# Patient Record
Sex: Male | Born: 1985 | Race: Black or African American | Hispanic: No | Marital: Single | Smoking: Never smoker
Health system: Southern US, Community
[De-identification: ages and names within clinical notes are randomized; demographics above are authoritative.]

## PROBLEM LIST (undated history)

## (undated) DIAGNOSIS — H469 Unspecified optic neuritis: Secondary | ICD-10-CM

---

## 2009-12-04 ENCOUNTER — Emergency Department (HOSPITAL_COMMUNITY): Admission: EM | Admit: 2009-12-04 | Discharge: 2009-12-04 | Payer: Self-pay | Admitting: Emergency Medicine

## 2015-01-19 ENCOUNTER — Emergency Department (HOSPITAL_COMMUNITY)
Admission: EM | Admit: 2015-01-19 | Discharge: 2015-01-19 | Disposition: A | Discharge status: Home / Self Care | Payer: BC Managed Care – PPO | Source: Home / Self Care | Attending: Family Medicine | Admitting: Family Medicine

## 2015-01-19 ENCOUNTER — Encounter (HOSPITAL_COMMUNITY): Payer: Self-pay | Admitting: Emergency Medicine

## 2015-01-19 DIAGNOSIS — G629 Polyneuropathy, unspecified: Secondary | ICD-10-CM

## 2015-01-19 HISTORY — DX: Unspecified optic neuritis: H46.9

## 2015-01-19 MED ORDER — PREDNISONE 10 MG PO KIT: PACK | ORAL | Status: DC

## 2015-01-19 NOTE — ED Provider Notes (Signed)
CSN: [id number]     Arrival date & time 01/19/15  1001 History   First MD Initiated Contact with Patient 01/19/15 1036     Chief Complaint  Patient presents with  . Numbness   (Consider location/radiation/quality/duration/timing/severity/associated sxs/prior Treatment) HPI  L facial numbness: started 7-8 days ago. Started out as pressure feeling around the eyes. Involves around the L eye and nose and l side of mouth. Denies pain or weakness. Deneis head trauma. Overall symptoms may be slightly improved. [facility] for [facility] infection.   Woke up today w/ R UE and LE numbness/tingling. R foot and R hand involved. Strength intact. Denies HA, syncoep, lightheadedness, vision change, CP, SOB, palpitations, fevers. May have slept on R side. Constant.    History reviewed. No pertinent past medical history. History reviewed. No pertinent past surgical history. Family History  Problem Relation Age of Onset  . Cancer Neg Hx   . Diabetes Neg Hx   . Heart failure Neg Hx   . Hyperlipidemia Neg Hx   . Hypertension Neg Hx    History  Substance Use Topics  . Smoking status: Never Smoker   . Smokeless tobacco: Not on file  . Alcohol Use: Yes    Review of Systems Per HPI with all other pertinent systems negative.   Allergies  Review of patient's allergies indicates no known allergies.  Home Medications   Prior to Admission medications   Medication Sig Start Date End Date Taking? Authorizing Provider  PredniSONE 10 MG KIT 12 day dose pack 01/19/15   Waldemar Dickens, MD   BP 115/80 mmHg  Pulse 72  Temp(Src) 98 F (36.7 C) (Oral)  Resp 14  SpO2 100% Physical Exam  Constitutional: He is oriented to person, place, and time. He appears well-developed and well-nourished. No distress.  HENT:  Head: Normocephalic and atraumatic.  Eyes: EOM are normal. Pupils are equal, round, and reactive to light.  Neck: Normal range of motion.  Cardiovascular: Normal rate,  normal heart sounds and intact distal pulses.   Pulmonary/Chest: Effort normal and breath sounds normal.  Abdominal: Soft. Bowel sounds are normal.  Musculoskeletal: Normal range of motion. He exhibits no edema or tenderness.  Neurological: He is alert and oriented to person, place, and time. He exhibits normal muscle tone.  CN2-12 intact, No dysmetria, Grip strength 5/5 bilat. Moves all extremities in coordinated fashion. Ambulation w/o difficulty.     Skin: Skin is warm and dry. No rash noted. He is not diaphoretic. No erythema. No pallor.  Psychiatric: He has a normal mood and affect. His behavior is normal. Judgment and thought content normal.    ED Course  Procedures (including critical care time) Labs Review Labs Reviewed - No data to display  Imaging Review No results found.   MDM   1. Neuropathy    Etiology unclear. No sign of acute stroke, Bell's palsy of the face or other acute intracranial process. Differential includes MS, idiopathic, other autoimmune dysfunction, vitamin deficiency, psychosomatic. Symptoms concerning enough along with history of distant optic neuritis that I feel it would be imperative for patient follow-up with neurologist. An appointment was made for March 15. Patient aware of this appointment Will start patient on 10 day steroid dose pack. Patient with strict instructions to go to the emergency room if symptoms significantly worsen.  Precautions given and all questions answered   Linna Darner, MD Family Medicine 01/19/2015, 11:27 AM      Waldemar Dickens,  MD 01/19/15 1127

## 2015-01-19 NOTE — Discharge Instructions (Signed)
The cause of your numbness is not clear. The neuropathy my be from an unknown cause (idiopathic), vitamin deficiency, or more serious condition such as MS. Please start the steroids and take them to completion.  Please follow up with with Dr. Everlena Cooper on 02/14/15 at 8:30. They will call you if an earlier appointment becomes available.   Please go to the emergency room if you get significantly worse.

## 2015-01-19 NOTE — ED Notes (Signed)
Reports numbness of left side of face on set this past Thursday night.  States couple days prior had pain and pressure in the left eye.  Woke this a.m with tingling in right and right foot.     [facility] and they felt symptoms was due to sinus infection and pt was prescribed antibiotics.

## 2015-01-20 ENCOUNTER — Ambulatory Visit (INDEPENDENT_AMBULATORY_CARE_PROVIDER_SITE_OTHER): Payer: BC Managed Care – PPO | Admitting: Neurology

## 2015-01-20 ENCOUNTER — Encounter: Payer: Self-pay | Admitting: Neurology

## 2015-01-20 VITALS — BP 116/70 | HR 82 | Temp 97.5°F | Resp 20 | Ht 72.0 in | Wt 152.0 lb

## 2015-01-20 DIAGNOSIS — R208 Other disturbances of skin sensation: Secondary | ICD-10-CM

## 2015-01-20 DIAGNOSIS — Z8669 Personal history of other diseases of the nervous system and sense organs: Secondary | ICD-10-CM

## 2015-01-20 DIAGNOSIS — R202 Paresthesia of skin: Secondary | ICD-10-CM

## 2015-01-20 DIAGNOSIS — R200 Anesthesia of skin: Secondary | ICD-10-CM

## 2015-01-20 NOTE — Progress Notes (Signed)
NEUROLOGY CONSULTATION NOTE  Howard Parker MRN: [id number] DOB: [birth year]  Referring provider: Dr. Marily Memos Primary care provider: None  Reason for consult:  Numbness, history of optic neuritis  HISTORY OF PRESENT ILLNESS: Howard Parker is a 29 year old right-handed man with remote history of optic neuritis who presents for multiple sclerosis.  Records from ED reviewed.  No labs or imaging were performed to review.  About a week ago, he developed left facial numbness associated with pressure around the left eye.  He went to a physician and was prescribed antibiotics for presumed sinusitis.  Yesterday, he woke up with numbness involving the right upper and lower extremities.  There was no associated pain or weakness.  At first, he thought he just slept on it but the numbness persisted.  He went to urgent care.  He was prescribed a prednisone taper and referred for neurology evaluation.  He reports the tingling sensation is around the left eye, cheek and left side of mouth in the face.  It also involves the entire right arm from the right side of the neck to the hand.  He notes the tingling from the right calf down to the foot.  He denies any visual changes or headache.  He has a remote history of right optic neuritis at least 10 years ago.  MRI of the brain was reportedly unremarkable.  He did not have an LP.  He was treated with IV Solumedrol and has not had any recurrence.  He still has some minor residual vision loss in the right eye.  He gets his eyes checked regularly.  He denies family history of demyelinating disease.  PAST MEDICAL HISTORY: Past Medical History  Diagnosis Date  . Optic neuritis     R. approximately 2008    PAST SURGICAL HISTORY: No past surgical history on file.  MEDICATIONS: Current Outpatient Prescriptions on File Prior to Visit  Medication Sig Dispense Refill  . PredniSONE 10 MG KIT 12 day dose pack 1 kit 0   No current facility-administered medications on  file prior to visit.    ALLERGIES: No Known Allergies  FAMILY HISTORY: Family History  Problem Relation Age of Onset  . Diabetes Neg Hx   . Heart failure Neg Hx   . Hyperlipidemia Neg Hx   . Cancer Maternal Grandmother     pancreatic   . Hypertension Paternal Grandmother     SOCIAL HISTORY: History   Social History  . Marital Status: Married    Spouse Name: N/A  . Number of Children: N/A  . Years of Education: N/A   Occupational History  . Not on file.   Social History Main Topics  . Smoking status: Never Smoker   . Smokeless tobacco: Never Used  . Alcohol Use: 0.0 oz/week    0 Standard drinks or equivalent per week     Comment: social   . Drug Use: No  . Sexual Activity:    Partners: Female   Other Topics Concern  . Not on file   Social History Narrative    REVIEW OF SYSTEMS: Constitutional: No fevers, chills, or sweats, no generalized fatigue, change in appetite Eyes: No visual changes, double vision, eye pain.  Mild residual blurred vision in right eye. Ear, nose and throat: No hearing loss, ear pain, nasal congestion, sore throat Cardiovascular: No chest pain, palpitations Respiratory:  No shortness of breath at rest or with exertion, wheezes GastrointestinaI: No nausea, vomiting, diarrhea, abdominal pain, fecal incontinence Genitourinary:  No dysuria,  urinary retention or frequency Musculoskeletal:  No neck pain, back pain Integumentary: No rash, pruritus, skin lesions Neurological: as above Psychiatric: No depression, insomnia, anxiety Endocrine: No palpitations, fatigue, diaphoresis, mood swings, change in appetite, change in weight, increased thirst Hematologic/Lymphatic:  No anemia, purpura, petechiae. Allergic/Immunologic: no itchy/runny eyes, nasal congestion, recent allergic reactions, rashes  PHYSICAL EXAM: Filed Vitals:   01/20/15 0932  BP: 116/70  Pulse: 82  Temp: 97.5 F (36.4 C)  Resp: 20   General: No acute distress Head:   Normocephalic/atraumatic Eyes:  fundi unremarkable, without vessel changes, exudates, hemorrhages or papilledema. Neck: supple, no paraspinal tenderness, full range of motion Back: No paraspinal tenderness Heart: regular rate and rhythm Lungs: Clear to auscultation bilaterally. Vascular: No carotid bruits. Neurological Exam: Mental status: alert and oriented to person, place, and time, recent and remote memory intact, fund of knowledge intact, attention and concentration intact, speech fluent and not dysarthric, language intact. Cranial nerves: CN I: not tested CN II: pupils equal, round and reactive to light, visual fields intact, fundi unremarkable, without vessel changes, exudates, hemorrhages or papilledema. CN III, IV, VI:  full range of motion, no nystagmus, no ptosis CN V: reduced sensation in left V2 distribution CN VII: upper and lower face symmetric CN VIII: hearing intact CN IX, X: gag intact, uvula midline CN XI: sternocleidomastoid and trapezius muscles intact CN XII: tongue midline Bulk & Tone: normal, no fasciculations. Motor:  5/5 throughout Sensation:  Pinprick and vibration intact. Deep Tendon Reflexes:  2+ (maybe slightly brisk but symmetric) throughout, toes downgoing Finger to nose testing:  No dysmetria Heel to shin:  No dysmetria Gait:  Normal station and stride.  Able to turn.  Had some minor difficulty with tandem walking. Romberg negative.  IMPRESSION: Left facial numbness and tingling Right sided numbness and tingling of arm and leg History of right optic neuritis.  A demyelinating disease is on the differential diagnosis.  PLAN: 1.  Will get MRI of brain and cervical spine with and without contrast 2.  Pending MRI results, further lab work will be pursued (or possible LP) 3.  Continue prednisone taper 4.  Follow up after testing  Thank you for allowing me to take part in the care of this patient.  Metta Clines, DO

## 2015-01-20 NOTE — Patient Instructions (Addendum)
First, we need to check MRI of brain and cervical spine with and without contrast.  MRI [facility]  02/02/15 at 3:45pm  Based on results, we will decide which tests need to be ordered (blood tests or possibly spinal tap).  Let's schedule follow up in 4 weeks for now to allow for any tests to be performed.

## 2015-02-01 ENCOUNTER — Ambulatory Visit (HOSPITAL_COMMUNITY)
Admission: RE | Admit: 2015-02-01 | Discharge: 2015-02-01 | Disposition: A | Discharge status: Home / Self Care | Payer: BC Managed Care – PPO | Source: Ambulatory Visit | Attending: Neurology | Admitting: Neurology

## 2015-02-01 DIAGNOSIS — G509 Disorder of trigeminal nerve, unspecified: Secondary | ICD-10-CM | POA: Diagnosis not present

## 2015-02-01 DIAGNOSIS — R202 Paresthesia of skin: Secondary | ICD-10-CM | POA: Insufficient documentation

## 2015-02-01 DIAGNOSIS — R200 Anesthesia of skin: Secondary | ICD-10-CM

## 2015-02-01 MED ORDER — GADOBENATE DIMEGLUMINE 529 MG/ML IV SOLN
15.0000 mL | Freq: Once | INTRAVENOUS | Status: AC | PRN
  Administered 2015-02-01: 14 mL via INTRAVENOUS

## 2015-02-02 ENCOUNTER — Other Ambulatory Visit: Payer: Self-pay | Admitting: *Deleted

## 2015-02-02 ENCOUNTER — Telehealth: Payer: Self-pay | Admitting: Neurology

## 2015-02-02 NOTE — Telephone Encounter (Signed)
I called and spoke with Howard Parker to discuss the MRI results.  It is consistent with MS.  I would like to set him up for Solu-Medrol  IV daily for 3 days.  He has an appointment with me on the 18th but we will try and get him in sooner to discuss management.  I answered all questions to the best of my ability.

## 2015-02-02 NOTE — Telephone Encounter (Signed)
Patient is aware of infusion day and time at [facility] on 02/06/15 at 8:45am the next 2 days he will schedule himself

## 2015-02-06 ENCOUNTER — Encounter (HOSPITAL_COMMUNITY)
Admission: RE | Admit: 2015-02-06 | Discharge: 2015-02-06 | Disposition: A | Discharge status: Home / Self Care | Payer: BC Managed Care – PPO | Source: Ambulatory Visit | Attending: Neurology | Admitting: Neurology

## 2015-02-06 ENCOUNTER — Other Ambulatory Visit (HOSPITAL_COMMUNITY): Payer: Self-pay | Admitting: *Deleted

## 2015-02-06 DIAGNOSIS — G35 Multiple sclerosis: Secondary | ICD-10-CM | POA: Diagnosis not present

## 2015-02-06 MED ORDER — SODIUM CHLORIDE 0.9 % IV SOLN
1000.0000 mg | Freq: Once | INTRAVENOUS | Status: AC
  Administered 2015-02-06: 1000 mg via INTRAVENOUS
  Filled 2015-02-06: qty 1000

## 2015-02-07 ENCOUNTER — Encounter (HOSPITAL_COMMUNITY)
Admission: RE | Admit: 2015-02-07 | Discharge: 2015-02-07 | Disposition: A | Discharge status: Home / Self Care | Payer: BC Managed Care – PPO | Source: Ambulatory Visit | Attending: Neurology | Admitting: Neurology

## 2015-02-07 DIAGNOSIS — G35 Multiple sclerosis: Secondary | ICD-10-CM | POA: Diagnosis not present

## 2015-02-07 MED ORDER — SODIUM CHLORIDE 0.9 % IV SOLN
1000.0000 mg | INTRAVENOUS | Status: DC
  Administered 2015-02-07: 1000 mg via INTRAVENOUS
  Filled 2015-02-07: qty 1000

## 2015-02-08 ENCOUNTER — Encounter (HOSPITAL_COMMUNITY)
Admission: RE | Admit: 2015-02-08 | Discharge: 2015-02-08 | Disposition: A | Discharge status: Home / Self Care | Payer: BC Managed Care – PPO | Source: Ambulatory Visit | Attending: Neurology | Admitting: Neurology

## 2015-02-08 DIAGNOSIS — G35 Multiple sclerosis: Secondary | ICD-10-CM | POA: Diagnosis not present

## 2015-02-08 MED ORDER — SODIUM CHLORIDE 0.9 % IV SOLN
1000.0000 mg | INTRAVENOUS | Status: DC
  Administered 2015-02-08: 1000 mg via INTRAVENOUS
  Filled 2015-02-08: qty 1000

## 2015-02-14 ENCOUNTER — Ambulatory Visit: Payer: BC Managed Care – PPO | Admitting: Neurology

## 2015-02-17 ENCOUNTER — Encounter: Payer: Self-pay | Admitting: Neurology

## 2015-02-17 ENCOUNTER — Ambulatory Visit (INDEPENDENT_AMBULATORY_CARE_PROVIDER_SITE_OTHER): Payer: BC Managed Care – PPO | Admitting: Neurology

## 2015-02-17 VITALS — BP 126/78 | HR 82 | Temp 97.9°F | Resp 20 | Ht 72.0 in | Wt 151.2 lb

## 2015-02-17 DIAGNOSIS — G35 Multiple sclerosis: Secondary | ICD-10-CM

## 2015-02-17 LAB — HEPATIC FUNCTION PANEL
ALT: 23 U/L (ref 0–53)
AST: 21 U/L (ref 0–37)
Albumin: 4.3 g/dL (ref 3.5–5.2)
Alkaline Phosphatase: 69 U/L (ref 39–117)
BILIRUBIN INDIRECT: 0.6 mg/dL (ref 0.2–1.2)
Bilirubin, Direct: 0.2 mg/dL (ref 0.0–0.3)
Total Bilirubin: 0.8 mg/dL (ref 0.2–1.2)
Total Protein: 7.1 g/dL (ref 6.0–8.3)

## 2015-02-17 LAB — CBC WITH DIFFERENTIAL/PLATELET
BASOS ABS: 0 10*3/uL (ref 0.0–0.1)
BASOS PCT: 0 % (ref 0–1)
EOS PCT: 2 % (ref 0–5)
Eosinophils Absolute: 0.1 10*3/uL (ref 0.0–0.7)
HEMATOCRIT: 47.9 % (ref 39.0–52.0)
Hemoglobin: 15.9 g/dL (ref 13.0–17.0)
LYMPHS ABS: 1.4 10*3/uL (ref 0.7–4.0)
Lymphocytes Relative: 27 % (ref 12–46)
MCH: 27.6 pg (ref 26.0–34.0)
MCHC: 33.2 g/dL (ref 30.0–36.0)
MCV: 83.2 fL (ref 78.0–100.0)
MONO ABS: 0.5 10*3/uL (ref 0.1–1.0)
MPV: 10.2 fL (ref 8.6–12.4)
Monocytes Relative: 9 % (ref 3–12)
NEUTROS ABS: 3.2 10*3/uL (ref 1.7–7.7)
NEUTROS PCT: 62 % (ref 43–77)
PLATELETS: 183 10*3/uL (ref 150–400)
RBC: 5.76 MIL/uL (ref 4.22–5.81)
RDW: 14.4 % (ref 11.5–15.5)
WBC: 5.2 10*3/uL (ref 4.0–10.5)

## 2015-02-17 NOTE — Patient Instructions (Signed)
1.  Please review the medications and call us with your choice. 2.  In the meantime, we will check some blood work (CBC with diff, LFTs, JC Virus antibody and vitamin D level) 3.  I want to see you back 3 months after starting your medication.  We will probably check blood work just prior to your follow up visit.

## 2015-02-17 NOTE — Progress Notes (Addendum)
NEUROLOGY FOLLOW UP OFFICE NOTE  Json Koelzer [id number]  HISTORY OF PRESENT ILLNESS: Howard Parker is a 29 year old right-handed man with remote history of optic neuritis who follows up for newly-diagnosed relapsing-remitting multiple sclerosis.  MRI of brain and cervical spine and labs reviewed.  He is accompanied by his parents who provide some history.  UPDATE: MRI of the brain with and without contrast performed on 02/01/15 showed 3 demyelinating lesions (in the pons, anterior right frontal lobe and left temporal lobe) with no enhancement.  MRI of the cervical spine showed focal right posterolateral lesion at the C5 level with no associated enhancement.  He underwent 3 days of 1gm of Solu-Medrol.  He still notes numbness and tingling in the right side but no weakness.  He said he started D3 5000 units this week.  HISTORY: In February, he developed left facial numbness associated with pressure around the left eye.  He went to a physician and was prescribed antibiotics for presumed sinusitis.  Yesterday, he woke up with numbness involving the right upper and lower extremities.  There was no associated pain or weakness.  At first, he thought he just slept on it but the numbness persisted.  He went to urgent care.  He was prescribed a prednisone taper and referred for neurology evaluation.  He reports the tingling sensation is around the left eye, cheek and left side of mouth in the face.  It also involves the entire right arm from the right side of the neck to the hand.  He notes the tingling from the right calf down to the foot.  He denies any visual changes or headache.  He has a remote history of right optic neuritis at least 10 years ago.  MRI of the brain was reportedly unremarkable.  He did not have an LP.  He was treated with IV Solumedrol and has not had any recurrence.  He still has some minor residual vision loss in the right eye.  He gets his eyes checked regularly.  He denies family  history of demyelinating disease.  PAST MEDICAL HISTORY: Past Medical History  Diagnosis Date  . Optic neuritis     R. approximately 2008    MEDICATIONS: Current Outpatient Prescriptions on File Prior to Visit  Medication Sig Dispense Refill  . PredniSONE 10 MG KIT 12 day dose pack (Patient not taking: Reported on 02/17/2015) 1 kit 0   No current facility-administered medications on file prior to visit.    ALLERGIES: No Known Allergies  FAMILY HISTORY: Family History  Problem Relation Age of Onset  . Diabetes Neg Hx   . Heart failure Neg Hx   . Hyperlipidemia Neg Hx   . Cancer Maternal Grandmother     pancreatic   . Hypertension Paternal Grandmother     SOCIAL HISTORY: History   Social History  . Marital Status: Single    Spouse Name: N/A  . Number of Children: N/A  . Years of Education: N/A   Occupational History  . Not on file.   Social History Main Topics  . Smoking status: Never Smoker   . Smokeless tobacco: Never Used  . Alcohol Use: 0.0 oz/week    0 Standard drinks or equivalent per week     Comment: social   . Drug Use: No  . Sexual Activity:    Partners: Female   Other Topics Concern  . Not on file   Social History Narrative    REVIEW OF SYSTEMS: Constitutional: No fevers,  chills, or sweats, no generalized fatigue, change in appetite Eyes: No visual changes, double vision, eye pain Ear, nose and throat: No hearing loss, ear pain, nasal congestion, sore throat Cardiovascular: No chest pain, palpitations Respiratory:  No shortness of breath at rest or with exertion, wheezes GastrointestinaI: No nausea, vomiting, diarrhea, abdominal pain, fecal incontinence Genitourinary:  No dysuria, urinary retention or frequency Musculoskeletal:  No neck pain, back pain Integumentary: No rash, pruritus, skin lesions Neurological: as above Psychiatric: No depression, insomnia, anxiety Endocrine: No palpitations, fatigue, diaphoresis, mood swings, change in  appetite, change in weight, increased thirst Hematologic/Lymphatic:  No anemia, purpura, petechiae. Allergic/Immunologic: no itchy/runny eyes, nasal congestion, recent allergic reactions, rashes  PHYSICAL EXAM: Filed Vitals:   02/17/15 0929  BP: 126/78  Pulse: 82  Temp: 97.9 F (36.6 C)  Resp: 20   General: No acute distress Head:  Normocephalic/atraumatic Eyes:  Fundoscopic exam unremarkable without vessel changes, exudates, hemorrhages or papilledema. Neck: supple, no paraspinal tenderness, full range of motion Heart:  Regular rate and rhythm Lungs:  Clear to auscultation bilaterally Back: No paraspinal tenderness Neurological Exam: alert and oriented to person, place, and time, recent and remote memory intact, fund of knowledge intact, attention and concentration intact, speech fluent and not dysarthric, language intact.  Saccadic eye movements on tracking.  Reduced sensation in left V2 distribution.  Otherwise CN II-XII intact.  Bulk and tone normal.  Muscle strength 5/5 throughout.  Reduced finger-thumb tapping speed and amplitude on right.  Sensation to pinprick and vibration intact.  Deep tendon reflexes 2+ brisk throughout except for 3+ in right lower extremity, toes downgoing.  Finger to nose intact.  Gait cautious with some trouble with tandem walking.  Romberg negative.  IMPRESSION: Relapsing-remitting multiple sclerosis, fitting McDonald Criteria  PLAN: 1.  Discussed various available disease-modifying agents in detail, including pros and cons.   2.  Will check CBC w/diff, LFTs, vitamin D level and JC Virus antibodies 3.  Will contact us ASAP with his choice of medication 4.  Follow up in 3 months after starting medication (will likely want to repeat labs prior to follow up)  45 minutes spent with patient and family, over 50% spent discussing disease and management options.  Metta Clines, DO

## 2015-02-18 LAB — VITAMIN D 25 HYDROXY (VIT D DEFICIENCY, FRACTURES): VIT D 25 HYDROXY: 36 ng/mL (ref 30–100)

## 2015-02-20 ENCOUNTER — Telehealth: Payer: Self-pay | Admitting: *Deleted

## 2015-02-20 ENCOUNTER — Telehealth: Payer: Self-pay | Admitting: Neurology

## 2015-02-20 NOTE — Telephone Encounter (Signed)
-----   Message from Drema Dallas, DO sent at 02/20/2015  7:05 AM EDT ----- Labs look okay.  Vitamin D level is 36, which is in the normal range.  I would remain on 5000 units daily.  JC virus antibodies are still pending ----- Message -----    From: Lab in [facility] Interface    Sent: 02/18/2015   2:12 AM      To: Drema Dallas, DO

## 2015-02-20 NOTE — Telephone Encounter (Signed)
Patient is aware of lab results he will continue vit d 5000 units . He has decided on Tecfidera

## 2015-02-20 NOTE — Telephone Encounter (Signed)
Left message for patient to return call to office. 

## 2015-02-20 NOTE — Telephone Encounter (Signed)
Pt called/returning your call at 1:14PM. C/b [phone]

## 2015-02-20 NOTE — Telephone Encounter (Signed)
Returning patients call at 2:10pm

## 2015-03-06 ENCOUNTER — Telehealth: Payer: Self-pay | Admitting: Neurology

## 2015-03-06 NOTE — Telephone Encounter (Signed)
Howard Parker, pt's mother called wanting to speak to a nurse regarding the medication for TECFIDERA. Pt has been waiting for 3 weeks for the medication to come in. Please call [facility] # [phone]

## 2015-03-06 NOTE — Telephone Encounter (Signed)
Patients mother Junius Roads is aware that all forms have been completed and sent to company and to insurance for his Tecfidera . I told her it could take up to 4 weeks before the meds are shipped I would let her  Know as soon as I hear that it is going to be shipped out

## 2015-05-26 ENCOUNTER — Ambulatory Visit: Payer: BC Managed Care – PPO | Admitting: Neurology

## 2015-05-31 ENCOUNTER — Other Ambulatory Visit: Payer: BC Managed Care – PPO

## 2015-05-31 ENCOUNTER — Telehealth: Payer: Self-pay | Admitting: *Deleted

## 2015-05-31 ENCOUNTER — Ambulatory Visit (INDEPENDENT_AMBULATORY_CARE_PROVIDER_SITE_OTHER): Payer: BC Managed Care – PPO | Admitting: Neurology

## 2015-05-31 ENCOUNTER — Encounter: Payer: Self-pay | Admitting: Neurology

## 2015-05-31 VITALS — BP 106/72 | HR 85 | Resp 16 | Ht 72.0 in | Wt 152.0 lb

## 2015-05-31 DIAGNOSIS — G35 Multiple sclerosis: Secondary | ICD-10-CM

## 2015-05-31 LAB — CBC WITH DIFFERENTIAL/PLATELET
Basophils Absolute: 0 10*3/uL (ref 0.0–0.1)
Basophils Relative: 0 % (ref 0–1)
Eosinophils Absolute: 0.1 10*3/uL (ref 0.0–0.7)
Eosinophils Relative: 3 % (ref 0–5)
HEMATOCRIT: 44.5 % (ref 39.0–52.0)
HEMOGLOBIN: 15 g/dL (ref 13.0–17.0)
LYMPHS PCT: 19 % (ref 12–46)
Lymphs Abs: 0.9 10*3/uL (ref 0.7–4.0)
MCH: 27.3 pg (ref 26.0–34.0)
MCHC: 33.7 g/dL (ref 30.0–36.0)
MCV: 81.1 fL (ref 78.0–100.0)
MONO ABS: 0.6 10*3/uL (ref 0.1–1.0)
MONOS PCT: 12 % (ref 3–12)
MPV: 10.4 fL (ref 8.6–12.4)
NEUTROS ABS: 3.2 10*3/uL (ref 1.7–7.7)
Neutrophils Relative %: 66 % (ref 43–77)
Platelets: 149 10*3/uL — ABNORMAL LOW (ref 150–400)
RBC: 5.49 MIL/uL (ref 4.22–5.81)
RDW: 14.1 % (ref 11.5–15.5)
WBC: 4.8 10*3/uL (ref 4.0–10.5)

## 2015-05-31 LAB — HEPATIC FUNCTION PANEL
ALT: 30 U/L (ref 0–53)
AST: 30 U/L (ref 0–37)
Albumin: 4.3 g/dL (ref 3.5–5.2)
Alkaline Phosphatase: 76 U/L (ref 39–117)
BILIRUBIN DIRECT: 0.1 mg/dL (ref 0.0–0.3)
BILIRUBIN INDIRECT: 0.4 mg/dL (ref 0.2–1.2)
Total Bilirubin: 0.5 mg/dL (ref 0.2–1.2)
Total Protein: 6.9 g/dL (ref 6.0–8.3)

## 2015-05-31 NOTE — Patient Instructions (Signed)
1.  Continue Tecfidera 2.  Continue D3 5000 units daily 3.  Check CBC with diff and LFTs today.  4.  Recheck CBC with diff, LFTs, MRI of the brain and cervical spine with and without contrast in 3 months with follow up soon afterwards.

## 2015-05-31 NOTE — Telephone Encounter (Signed)
Patient is aware of MRI brain and MRI Cervical spine on 09/05/15 at [facility]  3:45pm

## 2015-05-31 NOTE — Progress Notes (Signed)
NEUROLOGY FOLLOW UP OFFICE NOTE  Stefan Mcfeely [id number]  HISTORY OF PRESENT ILLNESS: Howard Parker is a 29 year old right-handed man with remote history of optic neuritis who follows up for newly-diagnosed relapsing-remitting multiple sclerosis.  MRI of brain and cervical spine and labs reviewed.  He is accompanied by his parents who provide some history.  UPDATE: Labs from March include normal CBC with differential, normal LFT and vitamin D level of 36.   He takes D3 5000 units daily.  He started Tecfidera and is doing well.  He does not feel any numbness.  He has no issues with the medication.  HISTORY: In February, he developed left facial numbness associated with pressure around the left eye.  He went to a physician and was prescribed antibiotics for presumed sinusitis.  In March, he woke up with numbness involving the right upper and lower extremities.  There was no associated pain or weakness.  At first, he thought he just slept on it but the numbness persisted.  He went to urgent care.  He was prescribed a prednisone taper and referred for neurology evaluation.  He noted tingling sensation around the left eye, cheek and left side of mouth in the face.  It also involved the entire right arm from the right side of the neck to the hand.  He notes the tingling from the right calf down to the foot.  He denies any visual changes or headache.  MRI of the brain with and without contrast performed on 02/01/15 showed 3 demyelinating lesions (in the pons, anterior right frontal lobe and left temporal lobe) with no enhancement.  MRI of the cervical spine showed focal right posterolateral lesion at the C5 level with no associated enhancement.  He underwent 3 days of 1gm of Solu-Medrol.  He has a remote history of right optic neuritis at least 10 years ago.  MRI of the brain was reportedly unremarkable.  He did not have an LP.  He was treated with IV Solumedrol and has not had any recurrence.  He still has  some minor residual vision loss in the right eye.  He gets his eyes checked regularly.  He denies family history of demyelinating disease.  PAST MEDICAL HISTORY: Past Medical History  Diagnosis Date  . Optic neuritis     R. approximately 2008    MEDICATIONS: No current outpatient prescriptions on file prior to visit.   No current facility-administered medications on file prior to visit.    ALLERGIES: No Known Allergies  FAMILY HISTORY: Family History  Problem Relation Age of Onset  . Diabetes Neg Hx   . Heart failure Neg Hx   . Hyperlipidemia Neg Hx   . Cancer Maternal Grandmother     pancreatic   . Hypertension Paternal Grandmother     SOCIAL HISTORY: History   Social History  . Marital Status: Single    Spouse Name: N/A  . Number of Children: N/A  . Years of Education: N/A   Occupational History  . Not on file.   Social History Main Topics  . Smoking status: Never Smoker   . Smokeless tobacco: Never Used  . Alcohol Use: 0.0 oz/week    0 Standard drinks or equivalent per week     Comment: social   . Drug Use: No  . Sexual Activity:    Partners: Female   Other Topics Concern  . Not on file   Social History Narrative    REVIEW OF SYSTEMS: Constitutional: No fevers,  chills, or sweats, no generalized fatigue, change in appetite Eyes: No visual changes, double vision, eye pain Ear, nose and throat: No hearing loss, ear pain, nasal congestion, sore throat Cardiovascular: No chest pain, palpitations Respiratory:  No shortness of breath at rest or with exertion, wheezes GastrointestinaI: No nausea, vomiting, diarrhea, abdominal pain, fecal incontinence Genitourinary:  No dysuria, urinary retention or frequency Musculoskeletal:  No neck pain, back pain Integumentary: No rash, pruritus, skin lesions Neurological: as above Psychiatric: No depression, insomnia, anxiety Endocrine: No palpitations, fatigue, diaphoresis, mood swings, change in appetite, change  in weight, increased thirst Hematologic/Lymphatic:  No anemia, purpura, petechiae. Allergic/Immunologic: no itchy/runny eyes, nasal congestion, recent allergic reactions, rashes  PHYSICAL EXAM: Filed Vitals:   05/31/15 1352  BP: 106/72  Pulse: 85  Resp: 16   General: No acute distress Head:  Normocephalic/atraumatic Eyes:  Fundoscopic exam unremarkable without vessel changes, exudates, hemorrhages or papilledema. Neck: supple, no paraspinal tenderness, full range of motion Heart:  Regular rate and rhythm Lungs:  Clear to auscultation bilaterally Back: No paraspinal tenderness Neurological Exam: alert and oriented to person, place, and time. Attention span and concentration intact, recent and remote memory intact, fund of knowledge intact.  Speech fluent and not dysarthric, language intact.  CN II-XII intact. Fundoscopic exam unremarkable without vessel changes, exudates, hemorrhages or papilledema.  Bulk and tone normal, muscle strength 5/5 throughout.  Sensation to light touch, temperature and vibration intact.  Deep tendon reflexes 2+ throughout, toes downgoing.  Finger to nose and heel to shin testing intact.  Gait normal, Timed 25 foot walk 3.35 seconds.  Romberg negative.  IMPRESSION: Relapsing remitting MS  PLAN: Tecfidera Vitamin D3 Check CBC w/diff and LFTs today Recheck CBC with diff, LFTs and MRI of brain/cervical spine with and without contrast in 3 months with follow up soon afterwards.  22 minutes spent face to face with patient, over 50% spent discussing management.   Shon Millet, DO

## 2015-06-19 ENCOUNTER — Other Ambulatory Visit: Payer: Self-pay | Admitting: *Deleted

## 2015-06-19 ENCOUNTER — Telehealth: Payer: Self-pay | Admitting: *Deleted

## 2015-06-19 DIAGNOSIS — G35 Multiple sclerosis: Secondary | ICD-10-CM

## 2015-06-19 NOTE — Telephone Encounter (Signed)
-----   Message from Drema Dallas, DO sent at 06/02/2015  7:39 AM EDT ----- Labs showed that his platelets have dropped a little bit.  I would like to repeat CBC with diff in 4 weeks.

## 2015-06-19 NOTE — Telephone Encounter (Signed)
Patient is aware Labs showed that his platelets have dropped a little bit. I would like to repeat CBC with diff in 4 weeks.

## 2015-07-04 ENCOUNTER — Other Ambulatory Visit: Payer: BC Managed Care – PPO

## 2015-07-04 DIAGNOSIS — G35 Multiple sclerosis: Secondary | ICD-10-CM

## 2015-07-04 LAB — CBC WITH DIFFERENTIAL/PLATELET
BASOS ABS: 0 10*3/uL (ref 0.0–0.1)
BASOS PCT: 1 % (ref 0–1)
EOS PCT: 3 % (ref 0–5)
Eosinophils Absolute: 0.1 10*3/uL (ref 0.0–0.7)
HCT: 42.6 % (ref 39.0–52.0)
Hemoglobin: 14.7 g/dL (ref 13.0–17.0)
LYMPHS PCT: 20 % (ref 12–46)
Lymphs Abs: 0.9 10*3/uL (ref 0.7–4.0)
MCH: 27.6 pg (ref 26.0–34.0)
MCHC: 34.5 g/dL (ref 30.0–36.0)
MCV: 80.1 fL (ref 78.0–100.0)
MONO ABS: 0.5 10*3/uL (ref 0.1–1.0)
MONOS PCT: 12 % (ref 3–12)
MPV: 10.7 fL (ref 8.6–12.4)
Neutro Abs: 2.8 10*3/uL (ref 1.7–7.7)
Neutrophils Relative %: 64 % (ref 43–77)
Platelets: 165 10*3/uL (ref 150–400)
RBC: 5.32 MIL/uL (ref 4.22–5.81)
RDW: 14 % (ref 11.5–15.5)
WBC: 4.3 10*3/uL (ref 4.0–10.5)

## 2015-07-10 ENCOUNTER — Telehealth: Payer: Self-pay | Admitting: *Deleted

## 2015-07-10 NOTE — Telephone Encounter (Signed)
-----   Message from Van Clines, MD sent at 07/05/2015  8:32 AM EDT ----- Pls let him know bloodwork is normal, thanks

## 2015-07-10 NOTE — Telephone Encounter (Signed)
Patient is aware labs are normal

## 2015-07-13 ENCOUNTER — Telehealth: Payer: Self-pay | Admitting: *Deleted

## 2015-07-13 ENCOUNTER — Other Ambulatory Visit: Payer: Self-pay | Admitting: *Deleted

## 2015-07-13 DIAGNOSIS — Z79899 Other long term (current) drug therapy: Secondary | ICD-10-CM

## 2015-07-13 DIAGNOSIS — G35 Multiple sclerosis: Secondary | ICD-10-CM

## 2015-07-13 NOTE — Telephone Encounter (Signed)
Patient is aware of labs he will come in on 08/10/15 for cbc with diff to be drawn

## 2015-07-13 NOTE — Telephone Encounter (Signed)
-----   Message from Drema Dallas, DO sent at 07/10/2015  9:02 AM EDT ----- Platelet count has improved.  However, I would like to recheck CBC in 4 weeks to follow up.

## 2015-09-05 ENCOUNTER — Ambulatory Visit (HOSPITAL_COMMUNITY)
Admission: RE | Admit: 2015-09-05 | Discharge: 2015-09-05 | Disposition: A | Discharge status: Home / Self Care | Payer: BC Managed Care – PPO | Source: Ambulatory Visit | Attending: Neurology | Admitting: Neurology

## 2015-09-05 DIAGNOSIS — R51 Headache: Secondary | ICD-10-CM | POA: Diagnosis not present

## 2015-09-05 DIAGNOSIS — M5030 Other cervical disc degeneration, unspecified cervical region: Secondary | ICD-10-CM | POA: Insufficient documentation

## 2015-09-05 DIAGNOSIS — G35 Multiple sclerosis: Secondary | ICD-10-CM

## 2015-09-05 MED ORDER — GADOBENATE DIMEGLUMINE 529 MG/ML IV SOLN
15.0000 mL | Freq: Once | INTRAVENOUS | Status: AC | PRN
  Administered 2015-09-05: 13 mL via INTRAVENOUS

## 2015-09-06 ENCOUNTER — Other Ambulatory Visit: Payer: Self-pay | Admitting: *Deleted

## 2015-09-06 ENCOUNTER — Telehealth: Payer: Self-pay | Admitting: Neurology

## 2015-09-06 DIAGNOSIS — Z8669 Personal history of other diseases of the nervous system and sense organs: Secondary | ICD-10-CM

## 2015-09-06 DIAGNOSIS — R202 Paresthesia of skin: Secondary | ICD-10-CM

## 2015-09-06 DIAGNOSIS — R200 Anesthesia of skin: Secondary | ICD-10-CM

## 2015-09-06 DIAGNOSIS — G35 Multiple sclerosis: Secondary | ICD-10-CM

## 2015-09-06 NOTE — Telephone Encounter (Signed)
Pt/mother/Kena/ called to get more info on son's recent MRI/ call back @ [phone]

## 2015-09-06 NOTE — Telephone Encounter (Signed)
Called Howard Parker back and left message for her to call me.

## 2015-09-11 ENCOUNTER — Telehealth: Payer: Self-pay | Admitting: Neurology

## 2015-09-11 NOTE — Telephone Encounter (Signed)
Printed all imagining studies and will leave out front for patients mother to pick up.. Mother is on his release of information

## 2015-09-11 NOTE — Telephone Encounter (Signed)
Pt/ mother/Kena/ called to request copies of his new and old MRI's/ call back @ [phone]

## 2015-09-13 ENCOUNTER — Ambulatory Visit (INDEPENDENT_AMBULATORY_CARE_PROVIDER_SITE_OTHER): Payer: BC Managed Care – PPO | Admitting: Neurology

## 2015-09-13 ENCOUNTER — Encounter: Payer: Self-pay | Admitting: Neurology

## 2015-09-13 ENCOUNTER — Other Ambulatory Visit (INDEPENDENT_AMBULATORY_CARE_PROVIDER_SITE_OTHER): Payer: BC Managed Care – PPO

## 2015-09-13 VITALS — BP 118/68 | HR 97 | Ht 71.5 in | Wt 146.0 lb

## 2015-09-13 DIAGNOSIS — G35 Multiple sclerosis: Secondary | ICD-10-CM

## 2015-09-13 LAB — CBC WITH DIFFERENTIAL/PLATELET
BASOS ABS: 0 10*3/uL (ref 0.0–0.1)
Basophils Relative: 0.5 % (ref 0.0–3.0)
Eosinophils Absolute: 0.1 10*3/uL (ref 0.0–0.7)
Eosinophils Relative: 2.7 % (ref 0.0–5.0)
HEMATOCRIT: 42.7 % (ref 39.0–52.0)
Hemoglobin: 14.1 g/dL (ref 13.0–17.0)
LYMPHS ABS: 0.8 10*3/uL (ref 0.7–4.0)
Lymphocytes Relative: 22.2 % (ref 12.0–46.0)
MCHC: 33.1 g/dL (ref 30.0–36.0)
MCV: 82.7 fl (ref 78.0–100.0)
MONO ABS: 0.3 10*3/uL (ref 0.1–1.0)
Monocytes Relative: 9.4 % (ref 3.0–12.0)
Neutro Abs: 2.3 10*3/uL (ref 1.4–7.7)
Neutrophils Relative %: 65.2 % (ref 43.0–77.0)
Platelets: 165 10*3/uL (ref 150.0–400.0)
RBC: 5.16 Mil/uL (ref 4.22–5.81)
RDW: 14.3 % (ref 11.5–15.5)
WBC: 3.6 10*3/uL — ABNORMAL LOW (ref 4.0–10.5)

## 2015-09-13 LAB — HEPATIC FUNCTION PANEL
ALK PHOS: 60 U/L (ref 39–117)
ALT: 19 U/L (ref 0–53)
AST: 20 U/L (ref 0–37)
Albumin: 4.2 g/dL (ref 3.5–5.2)
BILIRUBIN DIRECT: 0.2 mg/dL (ref 0.0–0.3)
BILIRUBIN TOTAL: 0.6 mg/dL (ref 0.2–1.2)
TOTAL PROTEIN: 6.7 g/dL (ref 6.0–8.3)

## 2015-09-13 LAB — VITAMIN D 25 HYDROXY (VIT D DEFICIENCY, FRACTURES): VITD: 94.24 ng/mL (ref 30.00–100.00)

## 2015-09-13 NOTE — Patient Instructions (Signed)
Continue Tecfidera Continue vitamin D3 5000 units daily Check CBC with diff, LFTs and Vitamin D level today.  Repeat CBC with diff and LFTs in 3 months prior to follow up Follow up in 3 months.

## 2015-09-13 NOTE — Progress Notes (Addendum)
NEUROLOGY FOLLOW UP OFFICE NOTE  Howard Parker [id number]  HISTORY OF PRESENT ILLNESS: Howard Parker is a 29 year old right-handed man with remote history of optic neuritis who follows up for newly-diagnosed relapsing-remitting multiple sclerosis.  MRI of brain and cervical spine and labs reviewed.    UPDATE: He takes D3 5000 units daily.  He takes Information systems manager.  He is doing well.  Sometimes, he notes flushing, which is better since taking aspirin.    When he first started Tecfidera, he had a drop in platelets from 183,000 to 149,000.  Repeat testing in August showed that it increased to 165,000.  WBC was 4.3 K/uL with absolute lymphocyte count of 900.  MRI of brain and cervical spine with and without contrast performed on 09/05/15 showed mild regression of lesions in brain, including left brainstem and middle cerebellar penduncle, as well as spinal cord.  HISTORY: In February, he developed left facial numbness associated with pressure around the left eye.  He went to a physician and was prescribed antibiotics for presumed sinusitis.  In March, he woke up with numbness involving the right upper and lower extremities.  There was no associated pain or weakness.  At first, he thought he just slept on it but the numbness persisted.  He went to urgent care.  He was prescribed a prednisone taper and referred for neurology evaluation.  He noted tingling sensation around the left eye, cheek and left side of mouth in the face.  It also involved the entire right arm from the right side of the neck to the hand.  He notes the tingling from the right calf down to the foot.  He denies any visual changes or headache.  MRI of the brain with and without contrast performed on 02/01/15 showed 3 demyelinating lesions (in the pons, anterior right frontal lobe and left temporal lobe) with no enhancement.  MRI of the cervical spine showed focal right posterolateral lesion at the C5 level with no associated enhancement. He  underwent 3 days of 1gm of Solu-Medrol.  He has a remote history of right optic neuritis at least 10 years ago.  MRI of the brain was reportedly unremarkable.  He did not have an LP.  He was treated with IV Solumedrol and has not had any recurrence.  He still has some minor residual vision loss in the right eye.  He gets his eyes checked regularly.  He denies family history of demyelinating disease.  PAST MEDICAL HISTORY: Past Medical History  Diagnosis Date  . Optic neuritis     R. approximately 2008    MEDICATIONS: Current Outpatient Prescriptions on File Prior to Visit  Medication Sig Dispense Refill  . Dimethyl Fumarate (TECFIDERA) 240 MG CPDR Take by mouth 2 (two) times daily.     No current facility-administered medications on file prior to visit.    ALLERGIES: No Known Allergies  FAMILY HISTORY: Family History  Problem Relation Age of Onset  . Diabetes Neg Hx   . Heart failure Neg Hx   . Hyperlipidemia Neg Hx   . Cancer Maternal Grandmother     pancreatic   . Hypertension Paternal Grandmother     SOCIAL HISTORY: Social History   Social History  . Marital Status: Single    Spouse Name: N/A  . Number of Children: N/A  . Years of Education: N/A   Occupational History  . Not on file.   Social History Main Topics  . Smoking status: Never Smoker   . Smokeless  tobacco: Never Used  . Alcohol Use: 0.0 oz/week    0 Standard drinks or equivalent per week     Comment: social   . Drug Use: No  . Sexual Activity:    Partners: Female   Other Topics Concern  . Not on file   Social History Narrative    REVIEW OF SYSTEMS: Constitutional: No fevers, chills, or sweats, no generalized fatigue, change in appetite Eyes: No visual changes, double vision, eye pain Ear, nose and throat: No hearing loss, ear pain, nasal congestion, sore throat Cardiovascular: No chest pain, palpitations Respiratory:  No shortness of breath at rest or with exertion,  wheezes GastrointestinaI: No nausea, vomiting, diarrhea, abdominal pain, fecal incontinence Genitourinary:  No dysuria, urinary retention or frequency Musculoskeletal:  No neck pain, back pain Integumentary: No rash, pruritus, skin lesions Neurological: as above Psychiatric: No depression, insomnia, anxiety Endocrine: No palpitations, fatigue, diaphoresis, mood swings, change in appetite, change in weight, increased thirst Hematologic/Lymphatic:  No anemia, purpura, petechiae. Allergic/Immunologic: no itchy/runny eyes, nasal congestion, recent allergic reactions, rashes  PHYSICAL EXAM: Filed Vitals:   09/13/15 1338  BP: 118/68  Pulse: 97   General: No acute distress.  Patient appears well-groomed.  normal body habitus. Head:  Normocephalic/atraumatic Eyes:  Fundoscopic exam unremarkable without vessel changes, exudates, hemorrhages or papilledema. Neck: supple, no paraspinal tenderness, full range of motion Heart:  Regular rate and rhythm Lungs:  Clear to auscultation bilaterally Back: No paraspinal tenderness Neurological Exam: alert and oriented to person, place, and time. Attention span and concentration intact, recent and remote memory intact, fund of knowledge intact.  Speech fluent and not dysarthric, language intact.  CN II-XII intact. Fundoscopic exam unremarkable without vessel changes, exudates, hemorrhages or papilledema.  Bulk and tone normal, muscle strength 5/5 throughout.  Sensation to light touch, temperature and vibration intact.  Deep tendon reflexes 2+ throughout, toes downgoing.  Finger to nose and heel to shin testing intact.  Gait normal, Timed 25 foot walk 3.03 seconds.  Romberg negative.  IMPRESSION: Relapsing-remitting MS, stable  PLAN: 1.  Tecfidera 2.  Vitamin D3 5000 IU daily 3.  Check CBC with diff, LFTs and D level today. ADDENDUM:  CBC unremarkable with WBC of 3.6 and absolute lymphocyte count of 0.8 K/uL.  LFTs normal.  Vitamin D level 94.  4.  Recheck  CBC with diff and LFTs in 3 months, about a week prior to follow up.  Shon Millet, DO

## 2015-09-22 ENCOUNTER — Ambulatory Visit: Payer: BC Managed Care – PPO | Admitting: Neurology

## 2015-10-30 ENCOUNTER — Other Ambulatory Visit: Payer: Self-pay

## 2015-10-30 DIAGNOSIS — G35 Multiple sclerosis: Secondary | ICD-10-CM

## 2015-10-30 MED ORDER — DIMETHYL FUMARATE 240 MG PO CPDR: 240.0000 mg | DELAYED_RELEASE_CAPSULE | Freq: Two times a day (BID) | ORAL | Status: DC

## 2015-11-14 ENCOUNTER — Telehealth: Payer: Self-pay | Admitting: Neurology

## 2015-11-14 NOTE — Telephone Encounter (Signed)
Message relayed to patient. Verbalized understanding and denied questions.   

## 2015-11-14 NOTE — Telephone Encounter (Signed)
Spoke with patient. States he has been having "weird" symptoms for the last few days. Severe lightheadedness when he first wakes up. Dissipates throughout the day, but will have bouts of lightheadedness/dizziness as he moves he head throughout the day. Pt also having pressure in his head when he bends down. Wants to know if this is a serious issue, it related to MS. Please advise.

## 2015-11-14 NOTE — Telephone Encounter (Signed)
PT called and said he was having some weird symptoms and would like a call back/Dawn CB# [phone]

## 2015-11-14 NOTE — Telephone Encounter (Signed)
I don't think it is related to MS.  He should keep hydrated or see his PCP/Urgent Care if it gets worse.

## 2015-12-07 ENCOUNTER — Telehealth: Payer: Self-pay | Admitting: Neurology

## 2015-12-07 ENCOUNTER — Other Ambulatory Visit: Payer: Self-pay

## 2015-12-07 DIAGNOSIS — G35 Multiple sclerosis: Secondary | ICD-10-CM

## 2015-12-07 MED ORDER — DIMETHYL FUMARATE 240 MG PO CPDR: 240.0000 mg | DELAYED_RELEASE_CAPSULE | Freq: Two times a day (BID) | ORAL | Status: DC

## 2015-12-07 NOTE — Telephone Encounter (Signed)
Pt needs to have his tecfidera called in pt phone number is [phone]

## 2015-12-18 ENCOUNTER — Telehealth: Payer: Self-pay

## 2015-12-18 DIAGNOSIS — G35 Multiple sclerosis: Secondary | ICD-10-CM

## 2015-12-18 NOTE — Telephone Encounter (Signed)
Spoke w/ patient. He will come in this week to have labs drawn. Pt did ask about his Tecfidera,Rx was submitted on 12/07/15. Pt wanted to know when RX should arrive. Called [facility] @ [phone], spoke with Morrie Sheldon. States they have not sent out prescription yet because pt has not pain copay of 300. Was given tecfidera patient assistance number at [phone].

## 2015-12-18 NOTE — Telephone Encounter (Signed)
Called patient back. Was getting Rx from [facility], but RX pharmacy changed at new year. Pt was given [facility] number to call and pay copay.

## 2015-12-18 NOTE — Telephone Encounter (Signed)
-----   Message from Richarda Overlie, [place] sent at 10/30/2015 10:46 AM EST ----- Recheck CBC with diff and LFTs in 3 months, about a week prior to follow up.

## 2015-12-26 ENCOUNTER — Other Ambulatory Visit (INDEPENDENT_AMBULATORY_CARE_PROVIDER_SITE_OTHER): Payer: BC Managed Care – PPO

## 2015-12-26 DIAGNOSIS — R208 Other disturbances of skin sensation: Secondary | ICD-10-CM

## 2015-12-26 DIAGNOSIS — R202 Paresthesia of skin: Secondary | ICD-10-CM

## 2015-12-26 DIAGNOSIS — R200 Anesthesia of skin: Secondary | ICD-10-CM

## 2015-12-26 DIAGNOSIS — Z8669 Personal history of other diseases of the nervous system and sense organs: Secondary | ICD-10-CM

## 2015-12-26 DIAGNOSIS — G35 Multiple sclerosis: Secondary | ICD-10-CM

## 2015-12-26 LAB — HEPATIC FUNCTION PANEL
ALBUMIN: 4.4 g/dL (ref 3.5–5.2)
ALT: 17 U/L (ref 0–53)
AST: 20 U/L (ref 0–37)
Alkaline Phosphatase: 64 U/L (ref 39–117)
Bilirubin, Direct: 0.1 mg/dL (ref 0.0–0.3)
TOTAL PROTEIN: 7.1 g/dL (ref 6.0–8.3)
Total Bilirubin: 0.5 mg/dL (ref 0.2–1.2)

## 2015-12-26 LAB — CBC WITH DIFFERENTIAL/PLATELET
BASOS ABS: 0 10*3/uL (ref 0.0–0.1)
Basophils Relative: 0.3 % (ref 0.0–3.0)
EOS ABS: 0.1 10*3/uL (ref 0.0–0.7)
EOS PCT: 2.1 % (ref 0.0–5.0)
HCT: 44.9 % (ref 39.0–52.0)
HEMOGLOBIN: 14.8 g/dL (ref 13.0–17.0)
Lymphocytes Relative: 15.9 % (ref 12.0–46.0)
Lymphs Abs: 0.8 10*3/uL (ref 0.7–4.0)
MCHC: 32.9 g/dL (ref 30.0–36.0)
MCV: 82.6 fl (ref 78.0–100.0)
MONO ABS: 0.6 10*3/uL (ref 0.1–1.0)
Monocytes Relative: 11.1 % (ref 3.0–12.0)
Neutro Abs: 3.5 10*3/uL (ref 1.4–7.7)
Neutrophils Relative %: 70.6 % (ref 43.0–77.0)
Platelets: 168 10*3/uL (ref 150.0–400.0)
RBC: 5.43 Mil/uL (ref 4.22–5.81)
RDW: 13.9 % (ref 11.5–15.5)
WBC: 5 10*3/uL (ref 4.0–10.5)

## 2015-12-28 ENCOUNTER — Encounter: Payer: Self-pay | Admitting: Neurology

## 2015-12-28 ENCOUNTER — Ambulatory Visit (INDEPENDENT_AMBULATORY_CARE_PROVIDER_SITE_OTHER): Payer: BC Managed Care – PPO | Admitting: Neurology

## 2015-12-28 VITALS — BP 110/66 | HR 72 | Ht 71.0 in | Wt 143.0 lb

## 2015-12-28 DIAGNOSIS — G35 Multiple sclerosis: Secondary | ICD-10-CM

## 2015-12-28 DIAGNOSIS — G44219 Episodic tension-type headache, not intractable: Secondary | ICD-10-CM

## 2015-12-28 NOTE — Patient Instructions (Signed)
1.  Continue Tecfidera and D3 5000 IU daily 2.  Try Excedrin Tension for tension headaches.  Limit use to no more than 2 days out of the week to prevent rebound headache.   If they become too frequent, we can try a preventative medication such as nortriptyline 3.  Repeat CBC with diff, MRI of brain and cervical spine with and without contrast in 6 months prior to follow up 4.  Follow up in 6 months.

## 2015-12-28 NOTE — Progress Notes (Signed)
NEUROLOGY FOLLOW UP OFFICE NOTE  Howard Parker [id number]  HISTORY OF PRESENT ILLNESS: Howard Parker is a 30 year old right-handed man with remote history of optic neuritis who follows up for newly-diagnosed relapsing-remitting multiple sclerosis.  MRI of brain and cervical spine and labs reviewed.    UPDATE: He takes D3 5000 units daily (last level was 94).  He takes Information systems manager.  He is doing well.  Sometimes, he notes flushing, which is better since taking aspirin.  He sometimes has tension-type headaches. Labs from 12/26/15:  WBC 5, ALC 800, LFTs normal.  HISTORY: In February, he developed left facial numbness associated with pressure around the left eye.  He went to a physician and was prescribed antibiotics for presumed sinusitis.  In March, he woke up with numbness involving the right upper and lower extremities.  There was no associated pain or weakness.  At first, he thought he just slept on it but the numbness persisted.  He went to urgent care.  He was prescribed a prednisone taper and referred for neurology evaluation.  He noted tingling sensation around the left eye, cheek and left side of mouth in the face.  It also involved the entire right arm from the right side of the neck to the hand.  He notes the tingling from the right calf down to the foot.  He denies any visual changes or headache.  MRI of the brain with and without contrast performed on 02/01/15 showed 3 demyelinating lesions (in the pons, anterior right frontal lobe and left temporal lobe) with no enhancement.  MRI of the cervical spine showed focal right posterolateral lesion at the C5 level with no associated enhancement. He underwent 3 days of 1gm of Solu-Medrol.  He has a remote history of right optic neuritis at least 10 years ago.  MRI of the brain was reportedly unremarkable.  He did not have an LP.  He was treated with IV Solumedrol and has not had any recurrence.  He still has some minor residual vision loss in the right  eye.  He gets his eyes checked regularly.  He denies family history of demyelinating disease.  MRI of brain and cervical spine with and without contrast performed on 09/05/15 showed mild regression of lesions in brain, including left brainstem and middle cerebellar penduncle, as well as spinal cord.  He is a Editor, commissioning.  PAST MEDICAL HISTORY: Past Medical History  Diagnosis Date  . Optic neuritis     R. approximately 2008    MEDICATIONS: Current Outpatient Prescriptions on File Prior to Visit  Medication Sig Dispense Refill  . Dimethyl Fumarate (TECFIDERA) 240 MG CPDR Take 1 capsule (240 mg total) by mouth 2 (two) times daily. 180 capsule 3   No current facility-administered medications on file prior to visit.    ALLERGIES: No Known Allergies  FAMILY HISTORY: Family History  Problem Relation Age of Onset  . Diabetes Neg Hx   . Heart failure Neg Hx   . Hyperlipidemia Neg Hx   . Cancer Maternal Grandmother     pancreatic   . Hypertension Paternal Grandmother     SOCIAL HISTORY: Social History   Social History  . Marital Status: Single    Spouse Name: N/A  . Number of Children: N/A  . Years of Education: N/A   Occupational History  . Not on file.   Social History Main Topics  . Smoking status: Never Smoker   . Smokeless tobacco: Never Used  . Alcohol Use: 0.0  oz/week    0 Standard drinks or equivalent per week     Comment: social   . Drug Use: No  . Sexual Activity:    Partners: Female   Other Topics Concern  . Not on file   Social History Narrative    REVIEW OF SYSTEMS: Constitutional: No fevers, chills, or sweats, no generalized fatigue, change in appetite Eyes: No visual changes, double vision, eye pain Ear, nose and throat: No hearing loss, ear pain, nasal congestion, sore throat Cardiovascular: No chest pain, palpitations Respiratory:  No shortness of breath at rest or with exertion, wheezes GastrointestinaI: No nausea, vomiting, diarrhea,  abdominal pain, fecal incontinence Genitourinary:  No dysuria, urinary retention or frequency Musculoskeletal:  No neck pain, back pain Integumentary: No rash, pruritus, skin lesions Neurological: as above Psychiatric: No depression, insomnia, anxiety Endocrine: No palpitations, fatigue, diaphoresis, mood swings, change in appetite, change in weight, increased thirst Hematologic/Lymphatic:  No anemia, purpura, petechiae. Allergic/Immunologic: no itchy/runny eyes, nasal congestion, recent allergic reactions, rashes  PHYSICAL EXAM: Filed Vitals:   12/28/15 1421  BP: 110/66  Pulse: 72   General: No acute distress.  Patient appears well-groomed.  normal body habitus. Head:  Normocephalic/atraumatic Eyes:  Fundoscopic exam unremarkable without vessel changes, exudates, hemorrhages or papilledema. Neck: supple, no paraspinal tenderness, full range of motion Heart:  Regular rate and rhythm Lungs:  Clear to auscultation bilaterally Back: No paraspinal tenderness Neurological Exam: alert and oriented to person, place, and time. Attention span and concentration intact, recent and remote memory intact, fund of knowledge intact.  Speech fluent and not dysarthric, language intact.  CN II-XII intact. Fundoscopic exam unremarkable without vessel changes, exudates, hemorrhages or papilledema.  Bulk and tone normal, muscle strength 5/5 throughout.  Sensation to light touch, temperature and vibration intact.  Deep tendon reflexes 2+ throughout, toes downgoing.  Finger to nose and heel to shin testing intact.  Gait normal, Timed 25 foot walk 2.65 seconds.  Romberg negative.  IMPRESSION: Relapsing-remitting MS, stable Tension-type headaches  PLAN: Tecfidera D3 5000 IU daily Excedrin Tension for headaches (limited to no more than 2 days out of the week) Repeat CBC with diff and MRI of brain/cervical spine with and without contrast in 6 months prior to follow up Follow up in 6 months.  22 minutes spent  face to face with patient, over 50% spent discussing management.  Shon Millet, DO

## 2016-03-21 ENCOUNTER — Telehealth: Payer: Self-pay

## 2016-03-21 ENCOUNTER — Other Ambulatory Visit: Payer: Self-pay

## 2016-03-21 NOTE — Telephone Encounter (Signed)
Pt is has developed some symptoms over the last few weeks. After getting to work and talking for about 5 - 10 minutes. Pt is having some dizziness, fogginess, lightheadedness, feeling out of it, when he turns his head feels "off", has had difficulty on escalators as of late. No pain, occasionally tension headache. Pt would like to know if this sounds MS related or not? Please advise.   [phone], VM okay.

## 2016-03-21 NOTE — Telephone Encounter (Signed)
Symptoms are vague, so it is hard to tell.  However, we can prescribe him a round of Solu-Medrol 1000mg   IV daily for 3 days, as it should help him feel better if this is.

## 2016-03-21 NOTE — Telephone Encounter (Signed)
Infusion set up. Pt aware that appointment is scheduled for [facility] Short Stay for 03/26/16 @ 8 a.m. Pt states he has to work. Contact info give so he may schedule himself.

## 2016-03-26 ENCOUNTER — Encounter (HOSPITAL_COMMUNITY): Payer: BC Managed Care – PPO

## 2016-03-26 ENCOUNTER — Telehealth: Payer: Self-pay

## 2016-03-26 NOTE — Telephone Encounter (Signed)
Was he given the prednisone for an inner ear issue?   If the dizziness is due to an inner ear issue, then prednisone is fine (which it may be, since he is doing better).  Some doctors prescribe prednisone for MS flare as well, although I typically use Solu-Medrol.  However,  I wouldn't take both Solu-Medrol and prednisone (I would just choose one or the other).

## 2016-03-26 NOTE — Telephone Encounter (Signed)
Mom aware.

## 2016-03-26 NOTE — Telephone Encounter (Signed)
Pt's mother, Howard Parker, called. States that pt has been doing great since his diagnosis last year. Other than the last 2 months. Mom encouraged pt to seek care at urgent care for dizziness, out of concern that it might just be due to fluid or wax buildup, which he suffered from a lot as a child. Pt was seen by UC on Thursday, both ears were impacted, cleaned out by UC. Pt states dizziness has declined from a 10 to an 8. He was given prednisone at [facility]. Would like to know if this is okay to take? (Pt has cancelled/rescheduled, mom unsure which, IV infusion due to work schedule). If okay to take, will it help if it is an MS flair? If not, if pt's does go for infusion will the steroids in that help his ears? Is it okay to take both prednisone and solumedrol? Please advise.

## 2016-06-05 ENCOUNTER — Telehealth: Payer: Self-pay

## 2016-06-05 DIAGNOSIS — G35 Multiple sclerosis: Secondary | ICD-10-CM

## 2016-06-05 NOTE — Telephone Encounter (Signed)
-----   Message from Richarda Overlie, [place] sent at 12/28/2015  2:35 PM EST ----- Regarding: Labs needed CBC w/ Diff, MRI cervical W and W/o. Then f/u

## 2016-06-05 NOTE — Telephone Encounter (Signed)
Pt aware. MRI scheduled for 06/12/16 @ 4:20. Pt will come to office for bloodwork.

## 2016-06-05 NOTE — Telephone Encounter (Signed)
Left message on vm for pt to return call to clinic.

## 2016-06-12 ENCOUNTER — Ambulatory Visit
Admission: RE | Admit: 2016-06-12 | Discharge: 2016-06-12 | Disposition: A | Discharge status: Home / Self Care | Payer: BC Managed Care – PPO | Source: Ambulatory Visit | Attending: Neurology | Admitting: Neurology

## 2016-06-12 DIAGNOSIS — G35 Multiple sclerosis: Secondary | ICD-10-CM

## 2016-06-12 MED ORDER — GADOBENATE DIMEGLUMINE 529 MG/ML IV SOLN
13.0000 mL | Freq: Once | INTRAVENOUS | Status: AC | PRN
  Administered 2016-06-12: 13 mL via INTRAVENOUS

## 2016-06-24 ENCOUNTER — Telehealth: Payer: Self-pay

## 2016-06-24 DIAGNOSIS — G35 Multiple sclerosis: Secondary | ICD-10-CM

## 2016-06-24 NOTE — Telephone Encounter (Signed)
Pt will come in tomorrow to have labs drawn. Pt stated he did not want to get MRI brain set up at this point. Stated he would need to wait for a better time to have this preformed.

## 2016-06-25 ENCOUNTER — Other Ambulatory Visit (INDEPENDENT_AMBULATORY_CARE_PROVIDER_SITE_OTHER): Payer: BC Managed Care – PPO

## 2016-06-25 DIAGNOSIS — G35 Multiple sclerosis: Secondary | ICD-10-CM | POA: Diagnosis not present

## 2016-06-25 LAB — CBC WITH DIFFERENTIAL/PLATELET
BASOS ABS: 0 10*3/uL (ref 0.0–0.1)
BASOS PCT: 0.5 % (ref 0.0–3.0)
Eosinophils Absolute: 0.1 10*3/uL (ref 0.0–0.7)
Eosinophils Relative: 2.3 % (ref 0.0–5.0)
HEMATOCRIT: 45.2 % (ref 39.0–52.0)
Hemoglobin: 15.1 g/dL (ref 13.0–17.0)
LYMPHS ABS: 0.7 10*3/uL (ref 0.7–4.0)
Lymphocytes Relative: 17.5 % (ref 12.0–46.0)
MCHC: 33.3 g/dL (ref 30.0–36.0)
MCV: 82.1 fl (ref 78.0–100.0)
MONOS PCT: 10.3 % (ref 3.0–12.0)
Monocytes Absolute: 0.4 10*3/uL (ref 0.1–1.0)
NEUTROS ABS: 2.7 10*3/uL (ref 1.4–7.7)
NEUTROS PCT: 69.4 % (ref 43.0–77.0)
PLATELETS: 161 10*3/uL (ref 150.0–400.0)
RBC: 5.5 Mil/uL (ref 4.22–5.81)
RDW: 14.1 % (ref 11.5–15.5)
WBC: 3.8 10*3/uL — ABNORMAL LOW (ref 4.0–10.5)

## 2016-06-25 LAB — HEPATIC FUNCTION PANEL
ALBUMIN: 4.4 g/dL (ref 3.5–5.2)
ALK PHOS: 72 U/L (ref 39–117)
ALT: 20 U/L (ref 0–53)
AST: 22 U/L (ref 0–37)
Bilirubin, Direct: 0.2 mg/dL (ref 0.0–0.3)
TOTAL PROTEIN: 7 g/dL (ref 6.0–8.3)
Total Bilirubin: 0.7 mg/dL (ref 0.2–1.2)

## 2016-06-26 ENCOUNTER — Encounter: Payer: Self-pay | Admitting: Neurology

## 2016-06-26 ENCOUNTER — Ambulatory Visit (INDEPENDENT_AMBULATORY_CARE_PROVIDER_SITE_OTHER): Payer: BC Managed Care – PPO | Admitting: Neurology

## 2016-06-26 VITALS — BP 108/60 | HR 87 | Ht 71.0 in | Wt 150.0 lb

## 2016-06-26 DIAGNOSIS — G35 Multiple sclerosis: Secondary | ICD-10-CM | POA: Diagnosis not present

## 2016-06-26 NOTE — Patient Instructions (Signed)
1.  Continue Tecfidera 2.  Repeat MRI of brain with and without contrast in October 3.  Repeat CBC with diff and LFTs in 6 months.  Follow up in 6 months after repeat labs.  Congratulations!

## 2016-06-26 NOTE — Progress Notes (Signed)
NEUROLOGY FOLLOW UP OFFICE NOTE  Howard Parker [id number]  HISTORY OF PRESENT ILLNESS: Howard Parker is a 30 year old right-handed man with remote history of optic neuritis who follows up for newly-diagnosed relapsing-remitting multiple sclerosis.     UPDATE: He is doing well.  No relapses or flare-ups.  He is getting married in 2 weeks.  He takes D3 5000 units daily (last level was 94).  He takes Scientific laboratory technician.  He is doing well.  Sometimes, he notes flushing, which is better since taking aspirin.  He sometimes has tension-type headaches. MRI of cervical spine with and without contrast from 06/12/16 was personally reviewed and revealed no new or progressed lesions.  Labs from yesterday include CBC with WBC 3.8, HGB 15.1, HCT 45.2, PLT 161 and absolute lymphocyte count of 0.7.  Hepatic panel was normal with TB 0.7, alk phos 72, AST 22 and ALT 20.   HISTORY: In February, he developed left facial numbness associated with pressure around the left eye.  He went to a physician and was prescribed antibiotics for presumed sinusitis.  In March, he woke up with numbness involving the right upper and lower extremities.  There was no associated pain or weakness.  At first, he thought he just slept on it but the numbness persisted.  He went to urgent care.  He was prescribed a prednisone taper and referred for neurology evaluation.  He noted tingling sensation around the left eye, cheek and left side of mouth in the face.  It also involved the entire right arm from the right side of the neck to the hand.  He notes the tingling from the right calf down to the foot.  He denies any visual changes or headache.  MRI of the brain with and without contrast performed on 02/01/15 showed 3 demyelinating lesions (in the pons, anterior right frontal lobe and left temporal lobe) with no enhancement.  MRI of the cervical spine showed focal right posterolateral lesion at the C5 level with no associated enhancement. He underwent 3 days  of 1gm of Solu-Medrol.   He has a remote history of right optic neuritis at least 10 years ago.  MRI of the brain was reportedly unremarkable.  He did not have an LP.  He was treated with IV Solumedrol and has not had any recurrence.  He still has some minor residual vision loss in the right eye.  He gets his eyes checked regularly.  He denies family history of demyelinating disease.   MRI of brain and cervical spine with and without contrast performed on 09/05/15 showed mild regression of lesions in brain, including left brainstem and middle cerebellar penduncle, as well as spinal cord at level of C5.   He is a Music therapist.  PAST MEDICAL HISTORY: Past Medical History:  Diagnosis Date  . Optic neuritis    R. approximately 2008    MEDICATIONS: Current Outpatient Prescriptions on File Prior to Visit  Medication Sig Dispense Refill  . Dimethyl Fumarate (TECFIDERA) 240 MG CPDR Take 1 capsule (240 mg total) by mouth 2 (two) times daily. 180 capsule 3   No current facility-administered medications on file prior to visit.     ALLERGIES: No Known Allergies  FAMILY HISTORY: Family History  Problem Relation Age of Onset  . Cancer Maternal Grandmother     pancreatic   . Hypertension Paternal Grandmother   . Diabetes Neg Hx   . Heart failure Neg Hx   . Hyperlipidemia Neg Hx  SOCIAL HISTORY: Social History   Social History  . Marital status: Single    Spouse name: N/A  . Number of children: N/A  . Years of education: N/A   Occupational History  . Not on file.   Social History Main Topics  . Smoking status: Never Smoker  . Smokeless tobacco: Never Used  . Alcohol use 0.0 oz/week     Comment: social   . Drug use: No  . Sexual activity: Yes    Partners: Female   Other Topics Concern  . Not on file   Social History Narrative  . No narrative on file    REVIEW OF SYSTEMS: Constitutional: No fevers, chills, or sweats, no generalized fatigue, change in appetite Eyes:  No visual changes, double vision, eye pain Ear, nose and throat: No hearing loss, ear pain, nasal congestion, sore throat Cardiovascular: No chest pain, palpitations Respiratory:  No shortness of breath at rest or with exertion, wheezes GastrointestinaI: No nausea, vomiting, diarrhea, abdominal pain, fecal incontinence Genitourinary:  No dysuria, urinary retention or frequency Musculoskeletal:  No neck pain, back pain Integumentary: No rash, pruritus, skin lesions Neurological: as above Psychiatric: No depression, insomnia, anxiety Endocrine: No palpitations, fatigue, diaphoresis, mood swings, change in appetite, change in weight, increased thirst Hematologic/Lymphatic:  No purpura, petechiae. Allergic/Immunologic: no itchy/runny eyes, nasal congestion, recent allergic reactions, rashes  PHYSICAL EXAM: Vitals:   06/26/16 1430  BP: 108/60  Pulse: 87   General: No acute distress.  Patient appears well-groomed.  normal body habitus. Head:  Normocephalic/atraumatic Eyes:  Fundi examined but not visualized Neck: supple, no paraspinal tenderness, full range of motion Heart:  Regular rate and rhythm Lungs:  Clear to auscultation bilaterally Back: No paraspinal tenderness Neurological Exam: alert and oriented to person, place, and time. Attention span and concentration intact, recent and remote memory intact, fund of knowledge intact.  Speech fluent and not dysarthric, language intact.  CN II-XII intact. Fundoscopic exam unremarkable without vessel changes, exudates, hemorrhages or papilledema.  Bulk and tone normal, muscle strength 5/5 throughout.  Sensation to light touch, temperature and vibration intact.  Deep tendon reflexes 2+ throughout, toes downgoing.  Finger to nose and heel to shin testing intact.  Gait normal, Timed 25 foot walk 2.73 seconds.  Romberg negative.  IMPRESSION: Relapsing-remitting MS, stable  PLAN: 1.  Continue Tecfidera 2.  Repeat MRI of brain with and without  contrast in October 3.  Repeat CBC with diff and LFTs in 6 months.  Follow up in 6 months after repeat labs.  20 minutes spent face to face with patient, over 50% spent counseling.  Metta Clines, DO

## 2016-09-10 ENCOUNTER — Telehealth: Payer: Self-pay

## 2016-09-10 DIAGNOSIS — G35 Multiple sclerosis: Secondary | ICD-10-CM

## 2016-09-10 NOTE — Telephone Encounter (Signed)
-----   Message from Richarda Overlie, [place] sent at 06/26/2016  2:41 PM EDT ----- MRI Brain with and withouth

## 2016-09-10 NOTE — Telephone Encounter (Signed)
Message left for pt to return call

## 2016-10-30 ENCOUNTER — Other Ambulatory Visit: Payer: Self-pay | Admitting: Neurology

## 2016-10-30 DIAGNOSIS — G35 Multiple sclerosis: Secondary | ICD-10-CM

## 2017-01-06 ENCOUNTER — Other Ambulatory Visit (INDEPENDENT_AMBULATORY_CARE_PROVIDER_SITE_OTHER): Payer: BC Managed Care – PPO

## 2017-01-06 ENCOUNTER — Encounter: Payer: Self-pay | Admitting: Neurology

## 2017-01-06 ENCOUNTER — Ambulatory Visit (INDEPENDENT_AMBULATORY_CARE_PROVIDER_SITE_OTHER): Payer: BC Managed Care – PPO | Admitting: Neurology

## 2017-01-06 VITALS — BP 112/64 | HR 93 | Ht 71.0 in | Wt 140.9 lb

## 2017-01-06 DIAGNOSIS — G35 Multiple sclerosis: Secondary | ICD-10-CM

## 2017-01-06 LAB — CBC WITH DIFFERENTIAL/PLATELET
BASOS ABS: 0 10*3/uL (ref 0.0–0.1)
Basophils Relative: 0.4 % (ref 0.0–3.0)
EOS ABS: 0.1 10*3/uL (ref 0.0–0.7)
Eosinophils Relative: 2.4 % (ref 0.0–5.0)
HEMATOCRIT: 45.9 % (ref 39.0–52.0)
Hemoglobin: 15.3 g/dL (ref 13.0–17.0)
LYMPHS PCT: 17.4 % (ref 12.0–46.0)
Lymphs Abs: 0.7 10*3/uL (ref 0.7–4.0)
MCHC: 33.3 g/dL (ref 30.0–36.0)
MCV: 83.2 fl (ref 78.0–100.0)
MONOS PCT: 13.3 % — AB (ref 3.0–12.0)
Monocytes Absolute: 0.6 10*3/uL (ref 0.1–1.0)
Neutro Abs: 2.8 10*3/uL (ref 1.4–7.7)
Neutrophils Relative %: 66.5 % (ref 43.0–77.0)
PLATELETS: 159 10*3/uL (ref 150.0–400.0)
RBC: 5.51 Mil/uL (ref 4.22–5.81)
RDW: 14.6 % (ref 11.5–15.5)
WBC: 4.3 10*3/uL (ref 4.0–10.5)

## 2017-01-06 LAB — HEPATIC FUNCTION PANEL
ALK PHOS: 76 U/L (ref 39–117)
ALT: 26 U/L (ref 0–53)
AST: 22 U/L (ref 0–37)
Albumin: 4.6 g/dL (ref 3.5–5.2)
Bilirubin, Direct: 0.1 mg/dL (ref 0.0–0.3)
TOTAL PROTEIN: 7.3 g/dL (ref 6.0–8.3)
Total Bilirubin: 0.5 mg/dL (ref 0.2–1.2)

## 2017-01-06 NOTE — Patient Instructions (Signed)
1.  Continue Tecfidera and vitamin D3 5000 units daily. 2.  Check CBC with diff and hepatic panel today and again in 6 months prior to follow up. 3.  Check MRI of brain with and without contrast 4.  Follow up in 6 months.

## 2017-01-06 NOTE — Progress Notes (Addendum)
NEUROLOGY FOLLOW UP OFFICE NOTE  Esiah Bazinet [id number]  HISTORY OF PRESENT ILLNESS: Astin Sayre is a 31 year old right-handed man with remote history of optic neuritis who follows up for newly-diagnosed relapsing-remitting multiple sclerosis.     UPDATE: He takes Tecfidera.  He takes D3 5000 units daily. He was supposed to have an MRI of the brain with and without contrast in October, but was side-tracked because he had a lot going on, such as getting married.  He is feeling well.  No fatigue, visual disturbance, weakness or numbness.   HISTORY: In February 2016, he developed left facial numbness associated with pressure around the left eye.  He went to a physician and was prescribed antibiotics for presumed sinusitis.  In March, he woke up with numbness involving the right upper and lower extremities.  There was no associated pain or weakness.  At first, he thought he just slept on it but the numbness persisted.  He went to urgent care.  He was prescribed a prednisone taper and referred for neurology evaluation.  He noted tingling sensation around the left eye, cheek and left side of mouth in the face.  It also involved the entire right arm from the right side of the neck to the hand.  He notes the tingling from the right calf down to the foot.  He denies any visual changes or headache.  MRI of the brain with and without contrast performed on 02/01/15 showed 3 demyelinating lesions (in the pons, anterior right frontal lobe and left temporal lobe) with no enhancement.  MRI of the cervical spine showed focal right posterolateral lesion at the C5 level with no associated enhancement. He underwent 3 days of 1gm of Solu-Medrol.   He has a remote history of right optic neuritis at least 10 years ago.  MRI of the brain was reportedly unremarkable.  He did not have an LP.  He was treated with IV Solumedrol and has not had any recurrence.  He still has some minor residual vision loss in the right eye.  He  gets his eyes checked regularly.  He denies family history of demyelinating disease.   MRI of brain and cervical spine with and without contrast performed on 09/05/15 showed mild regression of lesions in brain, including left brainstem and middle cerebellar penduncle, as well as spinal cord at level of C5.  MRI of cervical spine with and without contrast from 06/12/16 was personally reviewed and revealed no new or progressed lesions.     He is a Editor, commissioning.  PAST MEDICAL HISTORY: Past Medical History:  Diagnosis Date  . Optic neuritis    R. approximately 2008    MEDICATIONS: Current Outpatient Prescriptions on File Prior to Visit  Medication Sig Dispense Refill  . TECFIDERA 240 MG CPDR TAKE 1 CAPSULE (240MG ) BY MOUTH TWICE DAILY. 180 capsule 2   No current facility-administered medications on file prior to visit.     ALLERGIES: No Known Allergies  FAMILY HISTORY: Family History  Problem Relation Age of Onset  . Cancer Maternal Grandmother     pancreatic   . Hypertension Paternal Grandmother   . Diabetes Neg Hx   . Heart failure Neg Hx   . Hyperlipidemia Neg Hx     SOCIAL HISTORY: Social History   Social History  . Marital status: Single    Spouse name: N/A  . Number of children: N/A  . Years of education: N/A   Occupational History  . Not on file.  Social History Main Topics  . Smoking status: Never Smoker  . Smokeless tobacco: Never Used  . Alcohol use 0.0 oz/week     Comment: social   . Drug use: No  . Sexual activity: Yes    Partners: Female   Other Topics Concern  . Not on file   Social History Narrative  . No narrative on file    REVIEW OF SYSTEMS: Constitutional: No fevers, chills, or sweats, no generalized fatigue, change in appetite Eyes: No visual changes, double vision, eye pain Ear, nose and throat: No hearing loss, ear pain, nasal congestion, sore throat Cardiovascular: No chest pain, palpitations Respiratory:  No shortness of breath at  rest or with exertion, wheezes GastrointestinaI: No nausea, vomiting, diarrhea, abdominal pain, fecal incontinence Genitourinary:  No dysuria, urinary retention or frequency Musculoskeletal:  No neck pain, back pain Integumentary: No rash, pruritus, skin lesions Neurological: as above Psychiatric: No depression, insomnia, anxiety Endocrine: No palpitations, fatigue, diaphoresis, mood swings, change in appetite, change in weight, increased thirst Hematologic/Lymphatic:  No purpura, petechiae. Allergic/Immunologic: no itchy/runny eyes, nasal congestion, recent allergic reactions, rashes  PHYSICAL EXAM: Vitals:   01/06/17 1353  BP: 112/64  Pulse: 93   General: No acute distress.  Patient appears well-groomed.  normal body habitus. Head:  Normocephalic/atraumatic Eyes:  Fundi examined but not visualized Neck: supple, no paraspinal tenderness, full range of motion Heart:  Regular rate and rhythm Lungs:  Clear to auscultation bilaterally Back: No paraspinal tenderness Neurological Exam: alert and oriented to person, place, and time. Attention span and concentration intact, recent and remote memory intact, fund of knowledge intact.  Speech fluent and not dysarthric, language intact.  CN II-XII intact. Bulk and tone normal, muscle strength 5/5 throughout.  Sensation to light touch  intact.  Deep tendon reflexes 2+ throughout.  Finger to nose testing intact.  Gait normal, Romberg negative.  Timed 25 foot walk 2.63 seconds.  IMPRESSION: Relapsing-remitting multiple sclerosis  PLAN: 1.  Continue Tecfidera and vitamin D3 5000 units daily. 2.  Check CBC with diff and hepatic panel today and again in 6 months prior to follow up.  ADDENDUM:  LABS 01/06/17:  CBC with WBC 4.3, HGB 15.3, HCT 45.9, PLT 159 and ALC 0.7; hepatic panel with total bili 0.5, ALP 76, AST 22 and ALT 26. 3.  Check MRI of brain with and without contrast 4.  Follow up in 6 months.  19 minutes spent face to face with patient,  over 50% spent discussing testing.  Shon Millet, DO

## 2017-01-07 ENCOUNTER — Telehealth: Payer: Self-pay

## 2017-01-07 NOTE — Telephone Encounter (Signed)
Called patient to give lab results. No answer. Will try later.  

## 2017-01-07 NOTE — Telephone Encounter (Signed)
-----   Message from Drema Dallas, DO sent at 01/07/2017  7:38 AM EST ----- Labs look okay

## 2017-01-09 NOTE — Telephone Encounter (Signed)
Called patient. Gave lab results. Patient verbalized understanding.  

## 2017-02-02 ENCOUNTER — Ambulatory Visit
Admission: RE | Admit: 2017-02-02 | Discharge: 2017-02-02 | Disposition: A | Discharge status: Home / Self Care | Payer: BC Managed Care – PPO | Source: Ambulatory Visit | Attending: Neurology | Admitting: Neurology

## 2017-02-02 DIAGNOSIS — G35 Multiple sclerosis: Secondary | ICD-10-CM

## 2017-02-03 ENCOUNTER — Telehealth: Payer: Self-pay

## 2017-02-03 NOTE — Telephone Encounter (Signed)
-----   Message from Drema Dallas, DO sent at 02/03/2017  8:16 AM EST ----- MRI of brain looks stable.  No progression of disease.

## 2017-02-03 NOTE — Telephone Encounter (Signed)
Left message on machine for pt to return call to the office.  

## 2017-02-04 NOTE — Telephone Encounter (Signed)
Message relayed to patient. Verbalized understanding and denied questions.   

## 2017-07-07 ENCOUNTER — Ambulatory Visit: Payer: BC Managed Care – PPO | Admitting: Neurology

## 2017-08-06 ENCOUNTER — Telehealth: Payer: Self-pay | Admitting: Nutrition

## 2017-08-11 NOTE — Telephone Encounter (Signed)
Opened in error

## 2017-10-10 ENCOUNTER — Other Ambulatory Visit: Payer: Self-pay | Admitting: Neurology

## 2017-10-10 DIAGNOSIS — G35 Multiple sclerosis: Secondary | ICD-10-CM

## 2017-12-04 ENCOUNTER — Telehealth: Payer: Self-pay | Admitting: Neurology

## 2017-12-04 DIAGNOSIS — Z79899 Other long term (current) drug therapy: Secondary | ICD-10-CM

## 2017-12-04 MED ORDER — PREDNISONE 10 MG (21) PO TBPK: ORAL_TABLET | ORAL | 0 refills | Status: DC

## 2017-12-04 NOTE — Telephone Encounter (Signed)
Spoke with Dr Everlena Cooper, he agreed to Rx a prednisone taper. Pt must come in for labs immediately, must make and keep f/u appt he canceled and failed to r/s in August. Called Pt, no answer, LM on VM

## 2017-12-04 NOTE — Telephone Encounter (Signed)
Rcvd after hours telephone note, Pt called yesterday evening at 7:03p, stating has been having dizziness since the morning, going on all day. Was instructed to contact PCP w/in 3 days, drink several glasses of fruit juice, other clear liquids or water to improve hydration and blood glucose, lie down with feet elevated,  by Holland Commons, RN

## 2017-12-04 NOTE — Telephone Encounter (Signed)
Patient states that his MS is flared up and needs to know if we can call in a oral steroid for him please call

## 2017-12-04 NOTE — Telephone Encounter (Signed)
Called and spoke with Pt, advsd him he must have CBC, Pt to come tomorrow afternoon after work, made appt for 1/31 gave him the 7:30a appt.Pt works for the school system, needs early or late

## 2017-12-04 NOTE — Addendum Note (Signed)
Addended by: Dorthy Cooler on: 12/04/2017 04:40 PM   Modules accepted: Orders

## 2017-12-04 NOTE — Telephone Encounter (Signed)
Patient returned your call.  Thanks!

## 2017-12-05 ENCOUNTER — Other Ambulatory Visit: Payer: BC Managed Care – PPO

## 2017-12-05 DIAGNOSIS — Z79899 Other long term (current) drug therapy: Secondary | ICD-10-CM

## 2017-12-05 LAB — CBC WITH DIFFERENTIAL/PLATELET
BASOS PCT: 0.3 %
Basophils Absolute: 22 cells/uL (ref 0–200)
EOS ABS: 80 {cells}/uL (ref 15–500)
Eosinophils Relative: 1.1 %
HCT: 42.8 % (ref 38.5–50.0)
HEMOGLOBIN: 14.4 g/dL (ref 13.2–17.1)
Lymphs Abs: 971 cells/uL (ref 850–3900)
MCH: 27.2 pg (ref 27.0–33.0)
MCHC: 33.6 g/dL (ref 32.0–36.0)
MCV: 80.9 fL (ref 80.0–100.0)
MONOS PCT: 13.6 %
MPV: 11.2 fL (ref 7.5–12.5)
NEUTROS ABS: 5234 {cells}/uL (ref 1500–7800)
Neutrophils Relative %: 71.7 %
PLATELETS: 182 10*3/uL (ref 140–400)
RBC: 5.29 10*6/uL (ref 4.20–5.80)
RDW: 13.6 % (ref 11.0–15.0)
TOTAL LYMPHOCYTE: 13.3 %
WBC mixed population: 993 cells/uL — ABNORMAL HIGH (ref 200–950)
WBC: 7.3 10*3/uL (ref 3.8–10.8)

## 2017-12-31 ENCOUNTER — Other Ambulatory Visit: Payer: Self-pay | Admitting: Neurology

## 2017-12-31 DIAGNOSIS — G35 Multiple sclerosis: Secondary | ICD-10-CM

## 2018-01-01 ENCOUNTER — Ambulatory Visit: Payer: BC Managed Care – PPO | Admitting: Neurology

## 2018-02-20 ENCOUNTER — Telehealth: Payer: Self-pay

## 2018-02-20 NOTE — Telephone Encounter (Signed)
PA for Tecfidera was approved through [facility] from 03/21/20190 through 02/20/2020

## 2018-03-06 ENCOUNTER — Ambulatory Visit: Payer: BC Managed Care – PPO | Admitting: Neurology

## 2018-03-16 ENCOUNTER — Encounter: Payer: Self-pay | Admitting: Neurology

## 2018-03-16 ENCOUNTER — Ambulatory Visit: Payer: BC Managed Care – PPO | Admitting: Neurology

## 2018-03-16 VITALS — BP 104/60 | HR 98 | Ht 71.0 in | Wt 148.0 lb

## 2018-03-16 DIAGNOSIS — G35 Multiple sclerosis: Secondary | ICD-10-CM | POA: Diagnosis not present

## 2018-03-16 DIAGNOSIS — E559 Vitamin D deficiency, unspecified: Secondary | ICD-10-CM

## 2018-03-16 NOTE — Progress Notes (Signed)
NEUROLOGY FOLLOW UP OFFICE NOTE  Howard Parker [id number]  HISTORY OF PRESENT ILLNESS: Howard Parker is a 32 year old right-handed man with remote history of optic neuritis who follows up for multiple sclerosis.     UPDATE: MRI of brain with and without contrast from 02/02/17 was personally reviewed and demonstrated no new, progressed or active lesions. 12/05/17 CBC with diff showed WBC 7.3, HGB 14.4, HCT 42.8, PLT 182, ALC 971.  In January, he had an episode of dizziness and unsteady gait lasting a couple of weeks.  He was treated with a prednisone taper.  He is doing well now.  Vision:  No problems. Motor:  No problems. Sensory:  No problems. Pain:  No Gait:  Normal. Bowel/bladder:  No problems Fatigue:  No Cognition:  Good. Mood:  Denies depression.  Current disease modifying therapy:  Tecfidera Current medication:  No.  Takes D3 5000 IU daily.   HISTORY: In February 2016, he developed left facial numbness associated with pressure around the left eye.  He went to a physician and was prescribed antibiotics for presumed sinusitis.  In March, he woke up with numbness involving the right upper and lower extremities.  There was no associated pain or weakness.  At first, he thought he just slept on it but the numbness persisted.  He went to urgent care.  He was prescribed a prednisone taper and referred for neurology evaluation.  He noted tingling sensation around the left eye, cheek and left side of mouth in the face.  It also involved the entire right arm from the right side of the neck to the hand.  He notes the tingling from the right calf down to the foot.  He denies any visual changes or headache.  MRI of the brain with and without contrast performed on 02/01/15 showed 3 demyelinating lesions (in the pons, anterior right frontal lobe and left temporal lobe) with no enhancement.  MRI of the cervical spine showed focal right posterolateral lesion at the C5 level with no associated  enhancement. He underwent 3 days of 1gm of Solu-Medrol.   He has a remote history of right optic neuritis at least 10 years ago.  MRI of the brain was reportedly unremarkable.  He did not have an LP.  He was treated with IV Solumedrol and has not had any recurrence.  He still has some minor residual vision loss in the right eye.  He gets his eyes checked regularly.  He denies family history of demyelinating disease.   MRI of brain and cervical spine with and without contrast performed on 09/05/15 showed mild regression of lesions in brain, including left brainstem and middle cerebellar penduncle, as well as spinal cord at level of C5.  MRI of cervical spine with and without contrast from 06/12/16 was personally reviewed and revealed no new or progressed lesions.     He is a Editor, commissioning.  PAST MEDICAL HISTORY: Past Medical History:  Diagnosis Date  . Optic neuritis    R. approximately 2008    MEDICATIONS: Current Outpatient Medications on File Prior to Visit  Medication Sig Dispense Refill  . predniSONE (STERAPRED UNI-PAK 21 TAB) 10 MG (21) TBPK tablet As directed (Patient not taking: Reported on 03/16/2018) 21 tablet 0  . TECFIDERA 240 MG CPDR TAKE ONE CAPSULE BY MOUTH TWICE DAILY. STORE IN ORIGINAL CONTAINER ATROOM TEMPERATURE. 180 capsule 0   No current facility-administered medications on file prior to visit.     ALLERGIES: No Known Allergies  FAMILY HISTORY: Family History  Problem Relation Age of Onset  . Cancer Maternal Grandmother        pancreatic   . Hypertension Paternal Grandmother   . Diabetes Neg Hx   . Heart failure Neg Hx   . Hyperlipidemia Neg Hx     SOCIAL HISTORY: Social History   Socioeconomic History  . Marital status: Single    Spouse name: Not on file  . Number of children: Not on file  . Years of education: Not on file  . Highest education level: Not on file  Occupational History  . Not on file  Social Needs  . Financial resource strain: Not on  file  . Food insecurity:    Worry: Not on file    Inability: Not on file  . Transportation needs:    Medical: Not on file    Non-medical: Not on file  Tobacco Use  . Smoking status: Never Smoker  . Smokeless tobacco: Never Used  Substance and Sexual Activity  . Alcohol use: Yes    Alcohol/week: 0.0 oz    Comment: social   . Drug use: No  . Sexual activity: Yes    Partners: Female  Lifestyle  . Physical activity:    Days per week: Not on file    Minutes per session: Not on file  . Stress: Not on file  Relationships  . Social connections:    Talks on phone: Not on file    Gets together: Not on file    Attends religious service: Not on file    Active member of club or organization: Not on file    Attends meetings of clubs or organizations: Not on file    Relationship status: Not on file  . Intimate partner violence:    Fear of current or ex partner: Not on file    Emotionally abused: Not on file    Physically abused: Not on file    Forced sexual activity: Not on file  Other Topics Concern  . Not on file  Social History Narrative  . Not on file    REVIEW OF SYSTEMS: Constitutional: No fevers, chills, or sweats, no generalized fatigue, change in appetite Eyes: No visual changes, double vision, eye pain Ear, nose and throat: No hearing loss, ear pain, nasal congestion, sore throat Cardiovascular: No chest pain, palpitations Respiratory:  No shortness of breath at rest or with exertion, wheezes GastrointestinaI: No nausea, vomiting, diarrhea, abdominal pain, fecal incontinence Genitourinary:  No dysuria, urinary retention or frequency Musculoskeletal:  No neck pain, back pain Integumentary: No rash, pruritus, skin lesions Neurological: as above Psychiatric: No depression, insomnia, anxiety Endocrine: No palpitations, fatigue, diaphoresis, mood swings, change in appetite, change in weight, increased thirst Hematologic/Lymphatic:  No purpura,  petechiae. Allergic/Immunologic: no itchy/runny eyes, nasal congestion, recent allergic reactions, rashes  PHYSICAL EXAM: Vitals:   03/16/18 1431  BP: 104/60  Pulse: 98  SpO2: 99%   General: No acute distress.  Patient appears well-groomed.  normal body habitus. Head:  Normocephalic/atraumatic Eyes:  Fundi examined but not visualized Neck: supple, no paraspinal tenderness, full range of motion Heart:  Regular rate and rhythm Lungs:  Clear to auscultation bilaterally Back: No paraspinal tenderness Neurological Exam: alert and oriented to person, place, and time. Attention span and concentration intact, recent and remote memory intact, fund of knowledge intact.  Speech fluent and not dysarthric, language intact.  CN II-XII intact. Bulk and tone normal, muscle strength 5/5 throughout.  Sensation to light touch, temperature  and vibration intact.  Deep tendon reflexes 2+ throughout, toes downgoing.  Finger to nose and heel to shin testing intact.  Gait normal.  Timed 25 foot walk 2.95 seconds.  Romberg negative.  IMPRESSION: Relapsing remitting multiple sclerosis.  PLAN: 1.  Continue Tecfidera and D3 5000 IU daily. 2.  Repeat CBC with diff, CMP and D level in July. 3.  Repeat MRI of brain and cervical spine with and without contrast 4.  Follow up in 6 months.  19 minutes spent face to face with patient, over 50% spent discussing management.  Shon Millet, DO

## 2018-03-16 NOTE — Patient Instructions (Addendum)
1.  Continue Tecfidera and D3 5000 IU daily 2.  Check CBC with diff, CMP and vitamin D level in July. 3.  Check MRI of brain and cervical spine with and without contrast. We have sent a referral to [facility] for your MRI and they will call you directly to schedule your appt. They are located at [address] [facility]. If you need to contact them directly please call: [phone].  4.  Follow up in 6 months.

## 2018-03-27 ENCOUNTER — Encounter: Payer: Self-pay | Admitting: Neurology

## 2018-04-18 ENCOUNTER — Ambulatory Visit
Admission: RE | Admit: 2018-04-18 | Discharge: 2018-04-18 | Disposition: A | Discharge status: Home / Self Care | Payer: BC Managed Care – PPO | Source: Ambulatory Visit | Attending: Neurology | Admitting: Neurology

## 2018-04-18 DIAGNOSIS — G35 Multiple sclerosis: Secondary | ICD-10-CM

## 2018-04-18 MED ORDER — GADOBENATE DIMEGLUMINE 529 MG/ML IV SOLN
14.0000 mL | Freq: Once | INTRAVENOUS | Status: AC | PRN
  Administered 2018-04-18: 14 mL via INTRAVENOUS

## 2018-04-22 ENCOUNTER — Telehealth: Payer: Self-pay

## 2018-04-22 NOTE — Telephone Encounter (Signed)
Called and spoke with Pt, adised of MRI results

## 2018-04-22 NOTE — Telephone Encounter (Signed)
-----   Message from Drema Dallas, DO sent at 04/20/2018  9:26 AM EDT ----- MRIs are stable.  No progression of MS seen.

## 2018-07-22 ENCOUNTER — Other Ambulatory Visit: Payer: Self-pay | Admitting: Neurology

## 2018-07-22 DIAGNOSIS — G35 Multiple sclerosis: Secondary | ICD-10-CM

## 2018-09-04 IMAGING — MR MR CERVICAL SPINE WO/W CM
5 of 8 series · 28 of 48 positions shown · IV contrast (multihance)
Comparison: 06/12/2016

CLINICAL DATA: Multiple sclerosis with intermittent numbness and
weakness in the extremities, especially the right arm

EXAM:
MRI CERVICAL SPINE WITHOUT AND WITH CONTRAST
TECHNIQUE: Multiplanar and multiecho pulse sequences of the cervical spine, to
include the craniocervical junction and cervicothoracic junction,
were obtained without and with intravenous contrast.
CONTRAST:  14mL MULTIHANCE GADOBENATE DIMEGLUMINE 529 MG/ML IV SOLN

[Series 6: T1 · sagittal · 3.0mm · 0.66mm/px · 4 of 15 slices shown (1 of 3)]
[im 1/15]
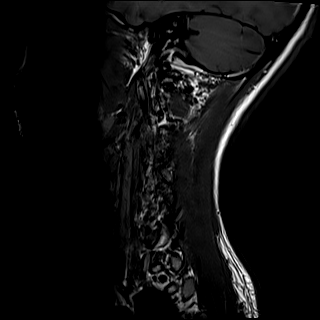
[im 5/15]
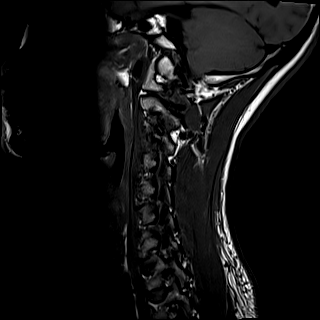
[im 10/15]
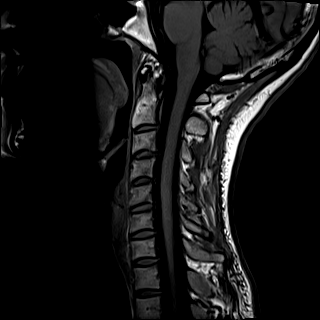
[im 15/15]
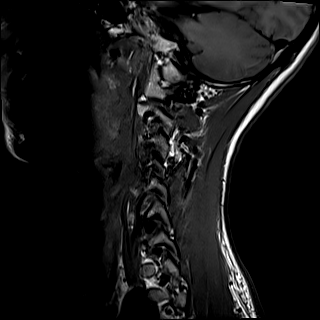

[Series 8: T2 · axial · 3.0mm · 0.50mm/px · z∈[-230,-126]mm · 8 of 34 slices shown (1 of 2)]
[im 1/34]
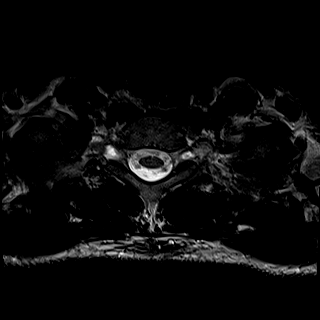
[im 5/34]
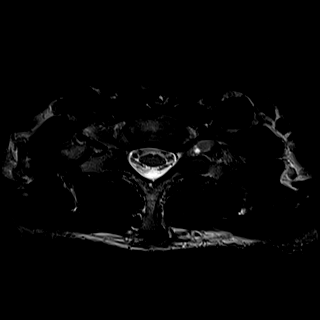
[im 10/34]
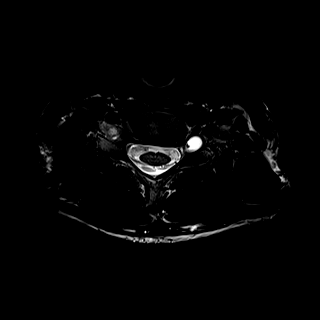
[im 15/34]
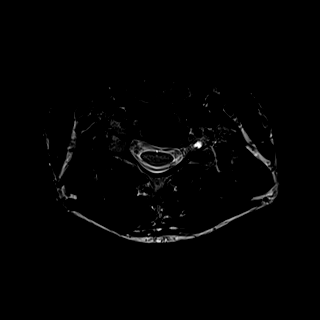
[im 19/34]
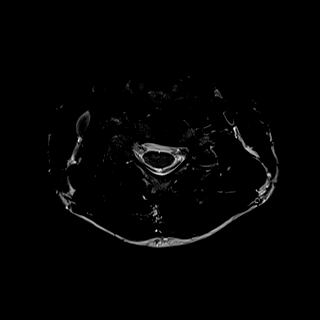
[im 24/34]
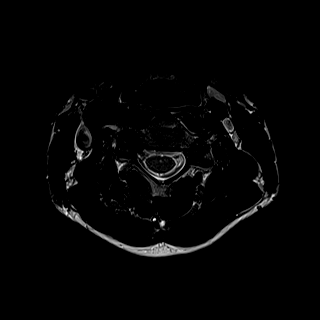
[im 29/34]
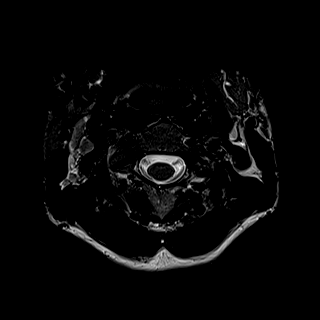
[im 34/34]
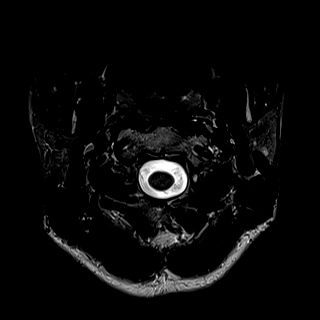

[Series 10: T1 · axial · non-contrast · 3.0mm · 0.31mm/px · z∈[-230,-126]mm · 8 of 34 slices shown (2 of 3)]
[im 1/34]
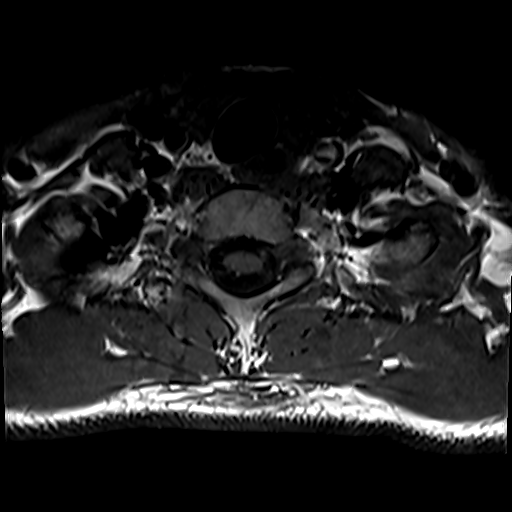
[im 5/34]
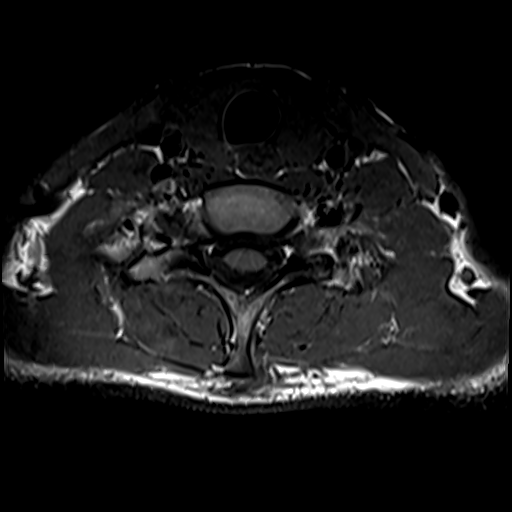
[im 10/34]
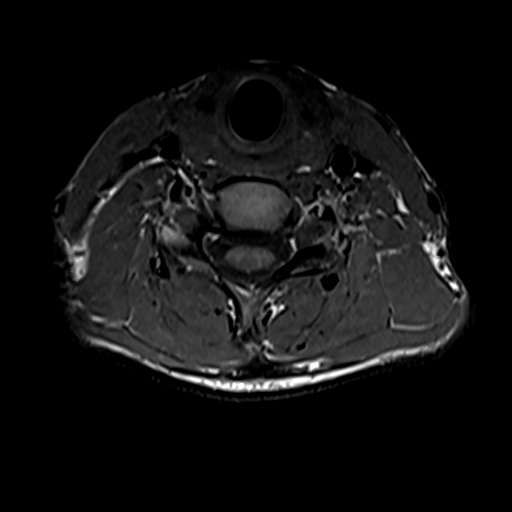
[im 15/34]
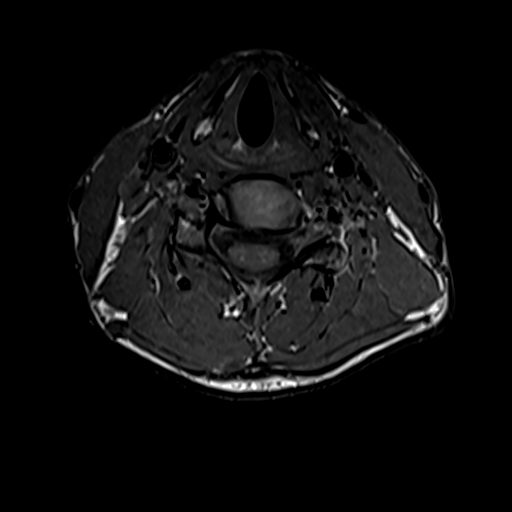
[im 19/34]
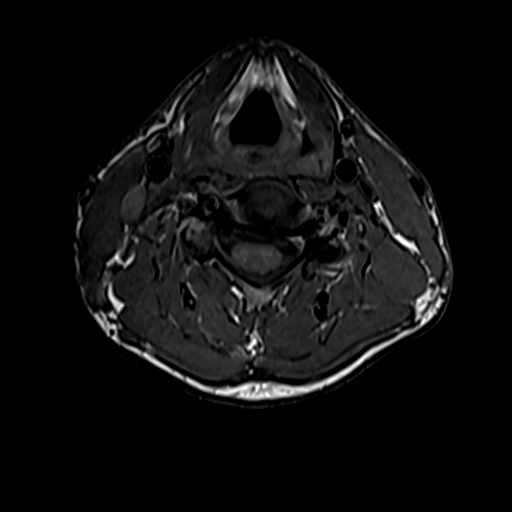
[im 24/34]
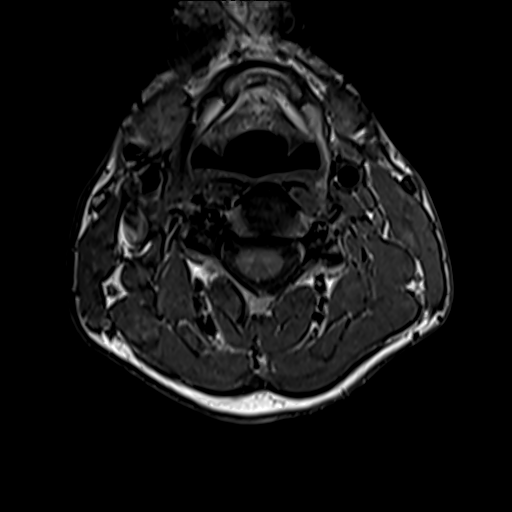
[im 29/34]
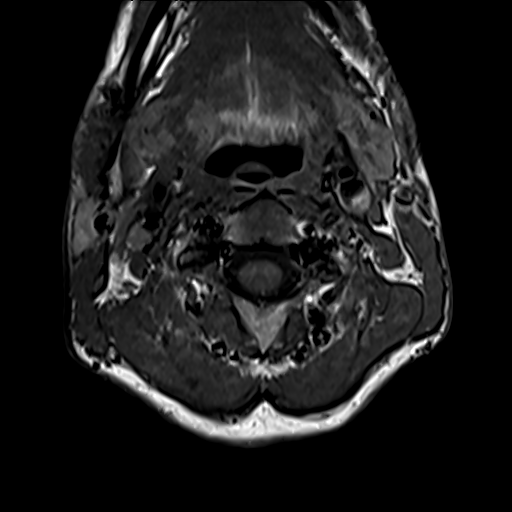
[im 34/34]
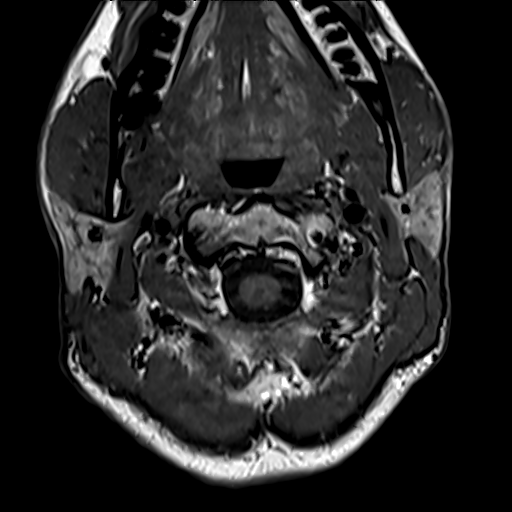

[Series 11: T2 · sagittal · 3.0mm · 0.55mm/px · 4 of 15 slices shown (2 of 2)]
[im 1/15]
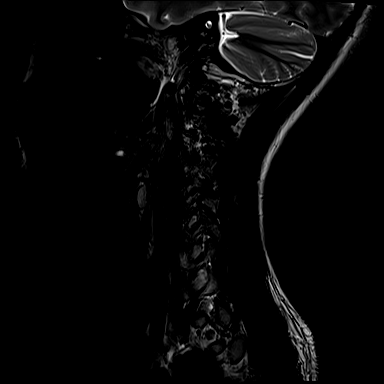
[im 5/15]
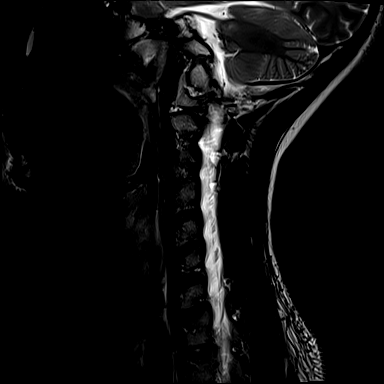
[im 10/15]
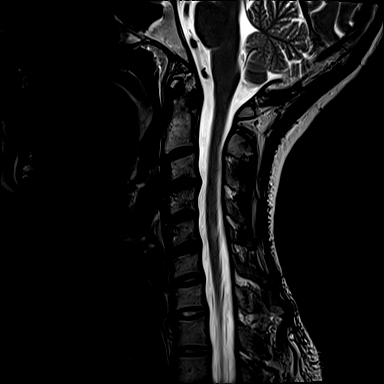
[im 15/15]
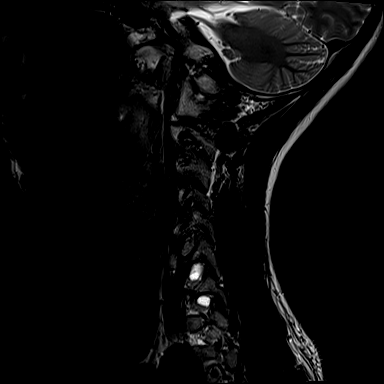

[Series 13: T1 · axial · 3.0mm · 0.31mm/px · z∈[-230,-186]mm · 4 of 34 slices shown (3 of 3)]
[im 1/34]
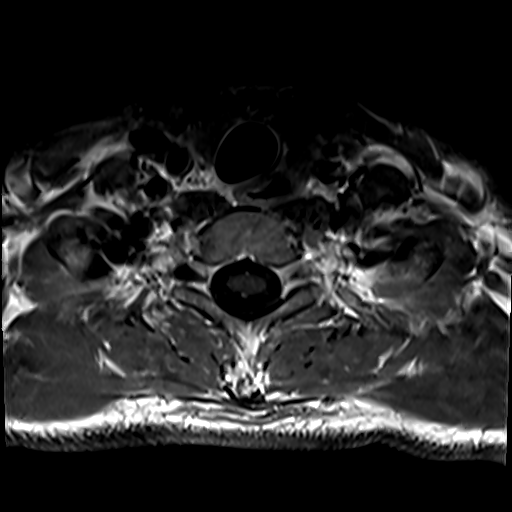
[im 5/34]
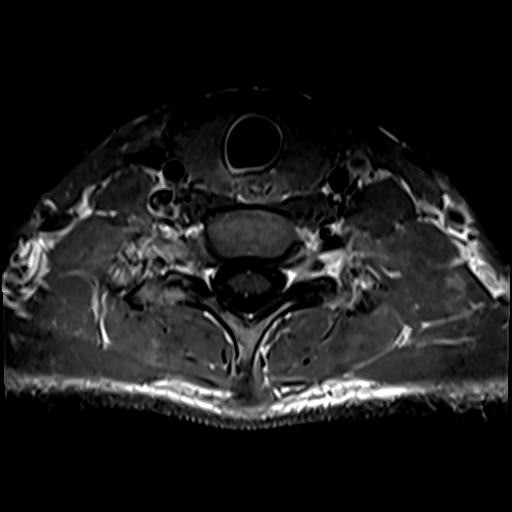
[im 10/34]
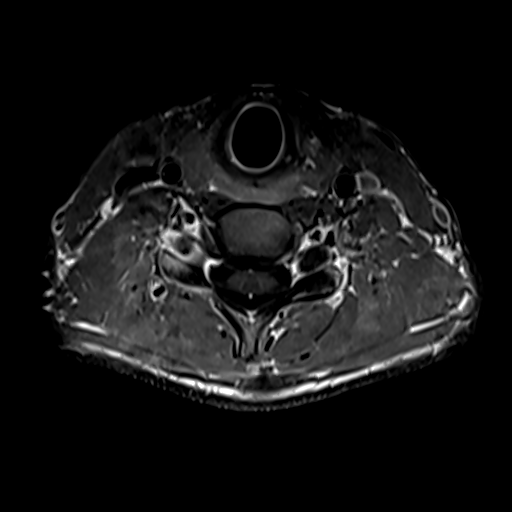
[im 15/34]
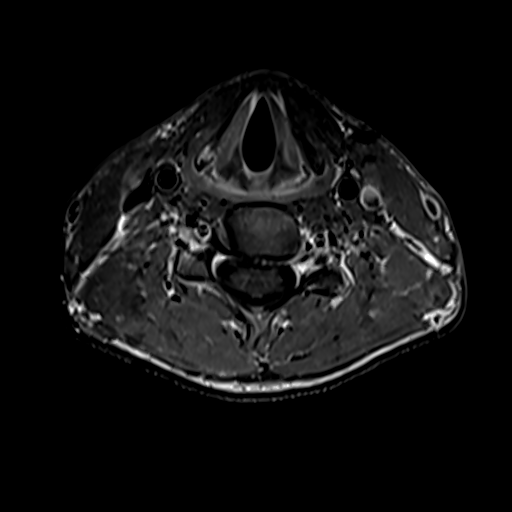

[28 of 48 positions shown; findings below may reference images not displayed]

FINDINGS: Alignment: Straightening.

Vertebrae: No fracture, evidence of discitis, or bone lesion.

Cord: Right peripheral cord T2 hyperintensity over short segment at
the C5 level. New new or progressive signal abnormality. No cord
thinning or enhancement (the plaque is mildly intrinsic T1
hyperintense).

Posterior Fossa, vertebral arteries, paraspinal tissues: Posterior
fossa findings described on dedicated brain MRI. Negative neck soft
tissues.

Disc levels:

C2-3: Unremarkable.

C3-4: Small right paracentral to uncovertebral ridge without
impingement

C4-5: Unremarkable.

C5-6: Shallow central disc protrusion.  No impingement

C6-7: Unremarkable.

C7-T1:Unremarkable.
IMPRESSION: Stable demyelinating plaque in the right cord at C5. No interval
cord demyelination.

## 2018-09-14 NOTE — Progress Notes (Signed)
NEUROLOGY FOLLOW UP OFFICE NOTE  Devontre Siedschlag [id number]  HISTORY OF PRESENT ILLNESS: Howard Parker is a 32 year old right-handed African-American male who follows up for multiple sclerosis.  UPDATE: Current disease modifying therapy:  Tecfidera Other current medications:  None.  Takes D3 5000 IU daily.  Vision:  No issues Motor:  No issues Sensory:  No issues Pain:  Shingles along the left thoracic region Gait:  No issues Bowel/Bladder:  No issues Fatigue:  No issues Cognition:  No issues Mood:  No issues  MRI of brain and cervical spine with and without contrast from 04/18/18 were personally reviewed and were stable compared to prior imaging.  HISTORY: He has a remote history of right optic neuritis in his early 53s. MRI of the brain was reportedly unremarkable. He did not have an LP. He was treated with IV Solumedrol and has not had any recurrence. He still has some minor residual vision loss in the right eye.  In February 2016, he developed left facial numbness associated with pressure around the left eye. He went to a physician and was prescribed antibiotics for presumed sinusitis. In March, he woke up with numbness involving the right upper and lower extremities. There was no associated pain or weakness. At first, he thought he just slept on it but the numbness persisted. He went to urgent care. He was prescribed a prednisone taper and referred for neurology evaluation. He noted tingling sensation around the left eye, cheek and left side of mouth in the face. It also involved the entire right arm from the right side of the neck to the hand. He notes the tingling from the right calf down to the foot. He denies any visual changes or headache. MRI of the brain with and without contrast performed on 02/01/15 showed 3 demyelinating lesions (in the pons, anterior right frontal lobe and left temporal lobe) with no enhancement. MRI of the cervical spine showed focal right  posterolateral lesion at the C5 level with no associated enhancement. He underwent 3 days of 1gm of Solu-Medrol.  In January 2019, he had episode of dizziness and unsteady gait lasting 2 weeks, which was treated with prednisone taper.   He gets his eyes checked regularly. He denies family history of demyelinating disease.  MRI of brain and cervical spine with and without contrast performed on 09/05/15 showed mild regression of lesions in brain, including left brainstem and middle cerebellar penduncle, as well as spinal cord at level of C5.  MRI of cervical spine with and without contrast from 06/12/16 was personally reviewed and revealed no new or progressed lesions.  MRI of brain with and without contrast from 02/02/17 was personally reviewed and demonstrated no new, progressed or active lesions.  He is a Editor, commissioning.  PAST MEDICAL HISTORY: Past Medical History:  Diagnosis Date  . Optic neuritis    R. approximately 2008    MEDICATIONS: Current Outpatient Medications on File Prior to Visit  Medication Sig Dispense Refill  . predniSONE (STERAPRED UNI-PAK 21 TAB) 10 MG (21) TBPK tablet As directed (Patient not taking: Reported on 03/16/2018) 21 tablet 0  . TECFIDERA 240 MG CPDR TAKE ONE CAPSULE BY MOUTH TWICE DAILY. STORE IN ORIGINAL CONTAINER ATROOM TEMPERATURE. 180 capsule 1   No current facility-administered medications on file prior to visit.     ALLERGIES: No Known Allergies  FAMILY HISTORY: Family History  Problem Relation Age of Onset  . Cancer Maternal Grandmother        pancreatic   .  Hypertension Paternal Grandmother   . Diabetes Neg Hx   . Heart failure Neg Hx   . Hyperlipidemia Neg Hx    SOCIAL HISTORY: Social History   Socioeconomic History  . Marital status: Single    Spouse name: Not on file  . Number of children: Not on file  . Years of education: Not on file  . Highest education level: Not on file  Occupational History  . Not on file  Social Needs  .  Financial resource strain: Not on file  . Food insecurity:    Worry: Not on file    Inability: Not on file  . Transportation needs:    Medical: Not on file    Non-medical: Not on file  Tobacco Use  . Smoking status: Never Smoker  . Smokeless tobacco: Never Used  Substance and Sexual Activity  . Alcohol use: Yes    Alcohol/week: 0.0 standard drinks    Comment: social   . Drug use: No  . Sexual activity: Yes    Partners: Female  Lifestyle  . Physical activity:    Days per week: Not on file    Minutes per session: Not on file  . Stress: Not on file  Relationships  . Social connections:    Talks on phone: Not on file    Gets together: Not on file    Attends religious service: Not on file    Active member of club or organization: Not on file    Attends meetings of clubs or organizations: Not on file    Relationship status: Not on file  . Intimate partner violence:    Fear of current or ex partner: Not on file    Emotionally abused: Not on file    Physically abused: Not on file    Forced sexual activity: Not on file  Other Topics Concern  . Not on file  Social History Narrative  . Not on file    REVIEW OF SYSTEMS: Constitutional: No fevers, chills, or sweats, no generalized fatigue, change in appetite Eyes: No visual changes, double vision, eye pain Ear, nose and throat: No hearing loss, ear pain, nasal congestion, sore throat Cardiovascular: No chest pain, palpitations Respiratory:  No shortness of breath at rest or with exertion, wheezes GastrointestinaI: No nausea, vomiting, diarrhea, abdominal pain, fecal incontinence Genitourinary:  No dysuria, urinary retention or frequency Musculoskeletal:  No neck pain, back pain Integumentary: recent shingles Neurological: as above Psychiatric: No depression, insomnia, anxiety Endocrine: No palpitations, fatigue, diaphoresis, mood swings, change in appetite, change in weight, increased thirst Hematologic/Lymphatic:  No  purpura, petechiae. Allergic/Immunologic: no itchy/runny eyes, nasal congestion, recent allergic reactions, rashes  PHYSICAL EXAM: Blood pressure 98/66, pulse 88, height 5' 11.5" (1.816 m), weight 152 lb (68.9 kg), SpO2 98 %. General: No acute distress.  Patient appears well-groomed.   Head:  Normocephalic/atraumatic Eyes:  Fundi examined but not visualized Neck: supple, no paraspinal tenderness, full range of motion Heart:  Regular rate and rhythm Lungs:  Clear to auscultation bilaterally Back: No paraspinal tenderness Neurological Exam: alert and oriented to person, place, and time. Attention span and concentration intact, recent and remote memory intact, fund of knowledge intact.  Speech fluent and not dysarthric, language intact.  CN II-XII intact. Bulk and tone normal, muscle strength 5/5 throughout.  Sensation to light touch, temperature and vibration intact.  Deep tendon reflexes 2+ throughout, toes downgoing.  Finger to nose and heel to shin testing intact.  Gait normal, Romberg negative. Timed 25 foot  walk 2.73 seconds.  IMPRESSION: Multiple sclerosis  PLAN: 1.  Continue Tecfidera 2.  Continue D3 5000 IU daily 3.  Check CBC with differential, CMP, and vitamin D level today and again in 6 months. 4.  Follow-up in 6 months following repeat labs.  16 minutes spent face to face with patient, over 50% spent discussing management.  Shon Millet, DO

## 2018-09-15 ENCOUNTER — Ambulatory Visit: Payer: BC Managed Care – PPO | Admitting: Neurology

## 2018-09-15 ENCOUNTER — Other Ambulatory Visit (INDEPENDENT_AMBULATORY_CARE_PROVIDER_SITE_OTHER): Payer: BC Managed Care – PPO

## 2018-09-15 ENCOUNTER — Encounter: Payer: Self-pay | Admitting: Neurology

## 2018-09-15 VITALS — BP 98/66 | HR 88 | Ht 71.5 in | Wt 152.0 lb

## 2018-09-15 DIAGNOSIS — Z79899 Other long term (current) drug therapy: Secondary | ICD-10-CM

## 2018-09-15 DIAGNOSIS — G35 Multiple sclerosis: Secondary | ICD-10-CM | POA: Diagnosis not present

## 2018-09-15 LAB — COMPREHENSIVE METABOLIC PANEL
ALBUMIN: 4.1 g/dL (ref 3.5–5.2)
ALT: 19 U/L (ref 0–53)
AST: 16 U/L (ref 0–37)
Alkaline Phosphatase: 63 U/L (ref 39–117)
BUN: 20 mg/dL (ref 6–23)
CALCIUM: 9 mg/dL (ref 8.4–10.5)
CHLORIDE: 101 meq/L (ref 96–112)
CO2: 30 meq/L (ref 19–32)
Creatinine, Ser: 0.82 mg/dL (ref 0.40–1.50)
GFR: 139.64 mL/min (ref >60.00)
Glucose, Bld: 135 mg/dL — ABNORMAL HIGH (ref 70–99)
POTASSIUM: 3.9 meq/L (ref 3.5–5.1)
Sodium: 138 mEq/L (ref 135–145)
Total Bilirubin: 0.5 mg/dL (ref 0.2–1.2)
Total Protein: 7.1 g/dL (ref 6.0–8.3)

## 2018-09-15 LAB — CBC WITH DIFFERENTIAL/PLATELET
BASOS PCT: 0.2 % (ref 0.0–3.0)
Basophils Absolute: 0 10*3/uL (ref 0.0–0.1)
EOS PCT: 0.1 % (ref 0.0–5.0)
Eosinophils Absolute: 0 10*3/uL (ref 0.0–0.7)
HEMATOCRIT: 44.8 % (ref 39.0–52.0)
HEMOGLOBIN: 14.7 g/dL (ref 13.0–17.0)
LYMPHS PCT: 5.5 % — AB (ref 12.0–46.0)
Lymphs Abs: 0.4 10*3/uL — ABNORMAL LOW (ref 0.7–4.0)
MCHC: 32.8 g/dL (ref 30.0–36.0)
MCV: 83.6 fl (ref 78.0–100.0)
MONOS PCT: 5.9 % (ref 3.0–12.0)
Monocytes Absolute: 0.4 10*3/uL (ref 0.1–1.0)
NEUTROS ABS: 6.1 10*3/uL (ref 1.4–7.7)
Neutrophils Relative %: 88.3 % — ABNORMAL HIGH (ref 43.0–77.0)
PLATELETS: 171 10*3/uL (ref 150.0–400.0)
RBC: 5.36 Mil/uL (ref 4.22–5.81)
RDW: 14.6 % (ref 11.5–15.5)
WBC: 7 10*3/uL (ref 4.0–10.5)

## 2018-09-15 LAB — VITAMIN D 25 HYDROXY (VIT D DEFICIENCY, FRACTURES): VITD: 81.09 ng/mL (ref 30.00–100.00)

## 2018-09-15 NOTE — Patient Instructions (Addendum)
1.  Continue Tecfidera  2.  Continue D3 5000 IU daily 3.  Check CBC with diff, CMP and vitamin D level today and again in 6 months 4.  Follow up in 6 months following repeat labs.  Your provider has requested that you have labwork completed today. Please go to [facility] ([id number]) on the second floor of this building before leaving the office today. You do not need to check in. If you are not called within 15 minutes please check with the front desk.

## 2018-09-16 NOTE — Addendum Note (Signed)
Addended by: Dorthy Cooler on: 09/16/2018 07:26 AM   Modules accepted: Orders

## 2018-09-22 ENCOUNTER — Telehealth: Payer: Self-pay

## 2018-09-22 ENCOUNTER — Telehealth: Payer: Self-pay | Admitting: Neurology

## 2018-09-22 DIAGNOSIS — Z79899 Other long term (current) drug therapy: Secondary | ICD-10-CM

## 2018-09-22 NOTE — Telephone Encounter (Signed)
-----   Message from Drema Dallas, DO sent at 09/17/2018  6:20 AM EDT ----- Overall labs are okay.  His lymphocyte count is slightly lower than I would like.  Therefore, we will need to check it more closely so we may need to check it once a month for now.  I would like to repeat CBC with diff in one month.

## 2018-09-22 NOTE — Telephone Encounter (Signed)
Called Pt, LMOVM for rtrn call re: lab results

## 2018-09-22 NOTE — Telephone Encounter (Signed)
Patient returned your call but said he would call back. Thanks

## 2018-09-23 NOTE — Telephone Encounter (Signed)
Called and spoke with Pt, he will come in November for labs

## 2018-09-23 NOTE — Addendum Note (Signed)
Addended by: Dorthy Cooler on: 09/23/2018 10:59 AM   Modules accepted: Orders

## 2019-03-31 ENCOUNTER — Other Ambulatory Visit: Payer: Self-pay | Admitting: Neurology

## 2019-03-31 DIAGNOSIS — G35 Multiple sclerosis: Secondary | ICD-10-CM

## 2019-12-23 ENCOUNTER — Other Ambulatory Visit: Payer: Self-pay | Admitting: Neurology

## 2019-12-23 DIAGNOSIS — G35 Multiple sclerosis: Secondary | ICD-10-CM

## 2019-12-31 ENCOUNTER — Telehealth: Payer: Self-pay | Admitting: Neurology

## 2019-12-31 NOTE — Telephone Encounter (Signed)
Ok

## 2019-12-31 NOTE — Telephone Encounter (Signed)
It is okay.  However, he has not been seen in the office since October 2019, well overdue for follow up.  Is he still taking Tecfidera.  If he is, then we must stop it as it has not been monitored (requires blood work every 6 months, specifically CBC with diff).  He would need to make a follow up appointment.

## 2019-12-31 NOTE — Telephone Encounter (Signed)
Patient called and would like to know if it would be safe for him to get the COVID vaccine? Please Call. Thank you

## 2019-12-31 NOTE — Telephone Encounter (Signed)
I contacted patient and he is still taken Tecifera

## 2020-01-05 ENCOUNTER — Telehealth: Payer: Self-pay | Admitting: Neurology

## 2020-01-05 NOTE — Telephone Encounter (Signed)
Patient called regarding his Tecfidera medication and it not being covered by his state. He is needing a different one that Insurance will cover. Please Call. Thank you

## 2020-01-05 NOTE — Telephone Encounter (Signed)
Please advise 

## 2020-01-06 NOTE — Telephone Encounter (Signed)
Pt is aware.  

## 2020-01-06 NOTE — Telephone Encounter (Signed)
Left message to call office back

## 2020-01-06 NOTE — Telephone Encounter (Signed)
He is the patient that I wanted to stop Tecfidera ASAP anyway as he has not been seen in the office for well over a year.  We can discuss options at his follow up.

## 2020-03-15 NOTE — Progress Notes (Signed)
Virtual Visit via Video Note The purpose of this virtual visit is to provide medical care while limiting exposure to the novel coronavirus.    Consent was obtained for video visit:  Yes.   Answered questions that patient had about telehealth interaction:  Yes.   I discussed the limitations, risks, security and privacy concerns of performing an evaluation and management service by telemedicine. I also discussed with the patient that there may be a patient responsible charge related to this service. The patient expressed understanding and agreed to proceed.  Pt location: Home Physician Location: office Name of referring provider:  No ref. provider found I connected with Howard Parker at patients initiation/request on 03/17/2020 at 10:50 AM EDT by video enabled telemedicine application and verified that I am speaking with the correct person using two identifiers. Pt MRN:  [id number] Pt DOB:  01/31/1986 Video Participants:  Howard Parker   History of Present Illness:  Howard Parker is a 34 year old right-handed African-American male who follows up for multiple sclerosis.  UPDATE: Patient last seen in October 2019.  Due to prolonged period of time since last visit, and since he has not had routine lab monitoring, I had discontinued prescribing Tecfidera until he made a follow up appointment and had repeat labs.  Current disease modifying therapy:  None.  Stopped Tecfidera at end of February.   Other current medications:  None.  Takes D3 5000 IU daily.  He is doing well. Vision:  No issues Motor:  No issues Sensory:  No issues Pain:  Shingles along the left thoracic region Gait:  No issues Bowel/Bladder:  No issues Fatigue:  No issues Cognition:  No issues Mood:  No issues  MRI of brain and cervical spine with and without contrast from 04/18/18 were personally reviewed and were stable compared to prior imaging.  HISTORY: He has a remote history of right optic neuritis in his early  71s.MRI of the brain was reportedly unremarkable.He did not have an LP.He was treated with IV Solumedrol and has not had any recurrence.He still has some minor residual vision loss in the right eye.  In February 2016, he developed left facial numbness associated with pressure around the left eye.He went to a physician and was prescribed antibiotics for presumed sinusitis.In March, he woke up with numbness involving the right upper and lower extremities.There was no associated pain or weakness.At first, he thought he just slept on it but the numbness persisted.He went to urgent care.He was prescribed a prednisone taper and referred for neurology evaluation.He noted tingling sensation around the left eye, cheek and left side of mouth in the face.It also involved the entire right arm from the right side of the neck to the hand.He notes the tingling from the right calf down to the foot.He denies any visual changes or headache.MRI of the brain with and without contrast performed on 02/01/15 showed 3 demyelinating lesions (in the pons, anterior right frontal lobe and left temporal lobe) with no enhancement.MRI of the cervical spine showed focal right posterolateral lesion at the C5 level with no associated enhancement. He underwent 3 days of 1gm of Solu-Medrol.  In January 2019, he had episode of dizziness and unsteady gait lasting 2 weeks, which was treated with prednisone taper.  He gets his eyes checked regularly.He denies family history of demyelinating disease.  MRI of brain and cervical spine with and without contrast performed on 09/05/15 showed mild regression of lesions in brain, including left brainstem and middle cerebellar penduncle, as well  as spinal cord at level of C5. MRI of cervical spine with and without contrast from 06/12/16 was personally reviewed and revealed no new or progressed lesions. MRI of brain with and without contrast from 02/02/17 was personally  reviewed and demonstrated no new, progressed or active lesions.  He is a Editor, commissioning.  Past Medical History: Past Medical History:  Diagnosis Date  . Optic neuritis    R. approximately 2008    Medications: Outpatient Encounter Medications as of 03/17/2020  Medication Sig  . predniSONE (STERAPRED UNI-PAK 21 TAB) 10 MG (21) TBPK tablet As directed (Patient not taking: Reported on 03/16/2018)  . TECFIDERA 240 MG CPDR TAKE ONE CAPSULE BY MOUTH TWICE DAILY. STORE IN ORIGINAL CONTAINER AT ROOM TEMPERATURE.   No facility-administered encounter medications on file as of 03/17/2020.    Allergies: No Known Allergies  Family History: Family History  Problem Relation Age of Onset  . Cancer Maternal Grandmother        pancreatic   . Hypertension Paternal Grandmother   . Diabetes Neg Hx   . Heart failure Neg Hx   . Hyperlipidemia Neg Hx     Social History: Social History   Socioeconomic History  . Marital status: Single    Spouse name: Not on file  . Number of children: Not on file  . Years of education: Not on file  . Highest education level: Not on file  Occupational History  . Not on file  Tobacco Use  . Smoking status: Never Smoker  . Smokeless tobacco: Never Used  Substance and Sexual Activity  . Alcohol use: Yes    Alcohol/week: 0.0 standard drinks    Comment: social   . Drug use: No  . Sexual activity: Yes    Partners: Female  Other Topics Concern  . Not on file  Social History Narrative  . Not on file   Social Determinants of Health   Financial Resource Strain:   . Difficulty of Paying Living Expenses:   Food Insecurity:   . Worried About Programme researcher, broadcasting/film/video in the Last Year:   . Barista in the Last Year:   Transportation Needs:   . Freight forwarder (Medical):   Marland Kitchen Lack of Transportation (Non-Medical):   Physical Activity:   . Days of Exercise per Week:   . Minutes of Exercise per Session:   Stress:   . Feeling of Stress :   Social  Connections:   . Frequency of Communication with Friends and Family:   . Frequency of Social Gatherings with Friends and Family:   . Attends Religious Services:   . Active Member of Clubs or Organizations:   . Attends Banker Meetings:   Marland Kitchen Marital Status:   Intimate Partner Violence:   . Fear of Current or Ex-Partner:   . Emotionally Abused:   Marland Kitchen Physically Abused:   . Sexually Abused:     Observations/Objective:   There were no vitals taken for this visit. No acute distress.  Alert and oriented.  Speech fluent and not dysarthric.  Language intact.  Eyes orthophoric on primary gaze.  Face symmetric.  Assessment and Plan:   Multiple sclerosis  1.  Restart tecfidera 120mg  twice daily for 1 week, then 240mg  twice daily 2.  Check CBC with diff, CMP and D level today and again in 6 months. 3.  Continue D3 5000 IU daily 4.  Follow up in 6 months  Follow Up Instructions:    -I  discussed the assessment and treatment plan with the patient. The patient was provided an opportunity to ask questions and all were answered. The patient agreed with the plan and demonstrated an understanding of the instructions.   The patient was advised to call back or seek an in-person evaluation if the symptoms worsen or if the condition fails to improve as anticipated.       Dudley Major, DO

## 2020-03-17 ENCOUNTER — Other Ambulatory Visit: Payer: Self-pay

## 2020-03-17 ENCOUNTER — Other Ambulatory Visit (INDEPENDENT_AMBULATORY_CARE_PROVIDER_SITE_OTHER): Payer: BC Managed Care – PPO

## 2020-03-17 ENCOUNTER — Telehealth (INDEPENDENT_AMBULATORY_CARE_PROVIDER_SITE_OTHER): Payer: BC Managed Care – PPO | Admitting: Neurology

## 2020-03-17 DIAGNOSIS — G35 Multiple sclerosis: Secondary | ICD-10-CM

## 2020-03-17 LAB — CBC WITH DIFFERENTIAL/PLATELET
Basophils Absolute: 0 10*3/uL (ref 0.0–0.1)
Basophils Relative: 0.5 % (ref 0.0–3.0)
Eosinophils Absolute: 0.1 10*3/uL (ref 0.0–0.7)
Eosinophils Relative: 2.7 % (ref 0.0–5.0)
HCT: 42.9 % (ref 39.0–52.0)
Hemoglobin: 14 g/dL (ref 13.0–17.0)
Lymphocytes Relative: 29.7 % (ref 12.0–46.0)
Lymphs Abs: 1.1 10*3/uL (ref 0.7–4.0)
MCHC: 32.7 g/dL (ref 30.0–36.0)
MCV: 84.4 fl (ref 78.0–100.0)
Monocytes Absolute: 0.5 10*3/uL (ref 0.1–1.0)
Monocytes Relative: 12.8 % — ABNORMAL HIGH (ref 3.0–12.0)
Neutro Abs: 2 10*3/uL (ref 1.4–7.7)
Neutrophils Relative %: 54.3 % (ref 43.0–77.0)
Platelets: 151 10*3/uL (ref 150.0–400.0)
RBC: 5.09 Mil/uL (ref 4.22–5.81)
RDW: 14.2 % (ref 11.5–15.5)
WBC: 3.6 10*3/uL — ABNORMAL LOW (ref 4.0–10.5)

## 2020-03-17 MED ORDER — DIMETHYL FUMARATE 120 MG PO CPDR: 120.0000 mg | DELAYED_RELEASE_CAPSULE | Freq: Two times a day (BID) | ORAL | 0 refills | Status: DC

## 2020-03-17 MED ORDER — DIMETHYL FUMARATE 240 MG PO CPDR: 240.0000 mg | DELAYED_RELEASE_CAPSULE | Freq: Two times a day (BID) | ORAL | 11 refills | Status: DC

## 2020-03-20 ENCOUNTER — Other Ambulatory Visit: Payer: Self-pay

## 2020-03-20 DIAGNOSIS — G35 Multiple sclerosis: Secondary | ICD-10-CM

## 2020-03-20 LAB — VITAMIN D 1,25 DIHYDROXY
Vitamin D 1, 25 (OH)2 Total: 59 pg/mL (ref 18–72)
Vitamin D2 1, 25 (OH)2: <8 pg/mL
Vitamin D3 1, 25 (OH)2: 59 pg/mL

## 2020-03-20 NOTE — Progress Notes (Signed)
Correct Vitamin D lab order.

## 2020-03-24 ENCOUNTER — Telehealth: Payer: Self-pay

## 2020-03-24 NOTE — Telephone Encounter (Signed)
Telephone call to pt, Advised pt of Insurance medication preferred list. Pt stats he would like to to try the Vermity. Pt can try to pick up with a Copay card   Will inform Dr. Everlena Cooper

## 2020-03-28 ENCOUNTER — Encounter: Payer: Self-pay | Admitting: *Deleted

## 2020-03-28 NOTE — Progress Notes (Addendum)
Howard Parker (Key: [id number]) Vumerity (Starter) 231MG  dr capsules   Form [employer] Electronic PA Form [id number] NCPDP) Created 1 day ago Sent to Plan 1 day ago Plan Response 1 day ago Submit Clinical Questions 1 day ago Determination Favorable 1 day ago Message from Plan Your PA request has been approved. Additional information will be provided in the approval communication. (Message 1145)   Notice of Approval Date: 03/28/2020 Howard Parker [id number] [place], [place] Plan Member Name: Howard Parker Plan Member ID: [id number] Prescriber Name: ADAM JAFFE Prescriber Phone: [phone] Prescriber Fax: [phone] Dear Howard Parker: [facility] [employer] received a request from your prescriber for coverage of Vumerity. As long as you remain covered by the [facility] and there are no changes to your plan benefits, this request is approved for the following time period: 03/28/2020 - 03/28/2021 Approvals may be subject to dosing limits in accordance with FDA approved labeling, evidence-based practice guidelines or your prescription drug plan benefits. The prescription drug plan requires that this medication be filled [facility]. If you have not done so already, a prescription can be faxed to [phone] along with a copy of this letter and the request will be processed. Sincerely, [facility] [employer] PA# [facility] J  I will call him to see if he wants to enroll in the Vumerity drug program for assistance.

## 2020-03-30 NOTE — Telephone Encounter (Signed)
Called to let him know Vumerity was approved and asked him if he wants to enroll in their program to receive copay card. He said yes. I told him he will need to sign for permission to allow [place] to forward his information. I told him to check his spam folder for Vumerity and/or [employer] if he does not receive the email via his inbox. He gave me the following email to use: [email or web address]  CoverMyMeds replied with enrollment as follows: Mattias Sookram (Key: [id number]) Vumerity (Starter) 231MG  OR CPDR  Form Vumerity New Patient Start Form Enrollment Form Created 32 minutes ago Submitted by Provider less than a minute ago Sent to 

## 2020-03-30 NOTE — Progress Notes (Signed)
Thank you! Your request has been successfully received by CoverMyMeds. If you indicated that the patient or legal representative was not present, then CoverMyMeds must first obtain the signature of the patient to complete the HIPAA Authorization. The completed HIPAA Authorization will allow this form to be sent to [employer]. Please notify your patient to expect this e-mail request.  Upon review, a Case Manager will deliver this report to you via the preferred method that you have indicated.  To speak with a Case Manager about your request, please contact [employer] at [phone].

## 2020-04-14 ENCOUNTER — Other Ambulatory Visit: Payer: Self-pay | Admitting: Neurology

## 2020-06-22 NOTE — Progress Notes (Signed)
Muilitple call attemps by [employer] to pt to check on the status of his Vumertiy. Pt unreachable for all attempts.

## 2020-09-11 ENCOUNTER — Telehealth: Payer: Self-pay | Admitting: Neurology

## 2020-09-11 NOTE — Telephone Encounter (Signed)
As it has been a couple of years since I last was able to exam him, I would like it to be in-office.  I would like to check CBC with diff, CMP and vitamin D levels prior to office visit.

## 2020-09-11 NOTE — Telephone Encounter (Signed)
I called and had to lmom for patient

## 2020-09-11 NOTE — Telephone Encounter (Signed)
Patient called in to update his appointment for 09/18/20 to a virtual visit and wanted to know if he needed to come in before the appointment to do any bloodwork?

## 2020-09-14 NOTE — Progress Notes (Signed)
NEUROLOGY FOLLOW UP OFFICE NOTE  Howard Parker [id number]  HISTORY OF PRESENT ILLNESS: Howard Parker a 34 year old right-handed African-American male who follows up for multiple sclerosis.  UPDATE: Switched to Vumerity.  He is feeling well.  Current disease modifying therapy: Vumerity Other current medications: None. Takes D3 5000 IU daily.  He is doing well. Vision:No issues Motor: No issues Sensory: No issues Pain: Shingles along the left thoracic region Gait: No issues Bowel/Bladder: No issues Fatigue: No issues Cognition: No issues Mood: No issues   HISTORY: He has a remote history of right optic neuritisin his early 57s.MRI of the brain was reportedly unremarkable.He did not have an LP.He was treated with IV Solumedrol and has not had any recurrence.He still has some minor residual vision loss in the right eye.  In February 2016, he developed left facial numbness associated with pressure around the left eye.He went to a physician and was prescribed antibiotics for presumed sinusitis.In March, he woke up with numbness involving the right upper and lower extremities.There was no associated pain or weakness.At first, he thought he just slept on it but the numbness persisted.He went to urgent care.He was prescribed a prednisone taper and referred for neurology evaluation.He noted tingling sensation around the left eye, cheek and left side of mouth in the face.It also involved the entire right arm from the right side of the neck to the hand.He notes the tingling from the right calf down to the foot.He denies any visual changes or headache.MRI of the brain with and without contrast performed on 02/01/15 showed 3 demyelinating lesions (in the pons, anterior right frontal lobe and left temporal lobe) with no enhancement.MRI of the cervical spine showed focal right posterolateral lesion at the C5 level with no associated enhancement. He  underwent 3 days of 1gm of Solu-Medrol.  In January 2019, he had episode of dizziness and unsteady gait lasting 2 weeks, which was treated with prednisone taper.  He gets his eyes checked regularly.He denies family history of demyelinating disease.  He is a Editor, commissioning.  Imaging: 02/01/2015 MRI BRAIN W WO:  3 demyelinating lesions (in the pons, anterior right frontal lobe and left temporal lobe) with no enhancement. 02/01/2015 MRI CERVICAL SPINE W WO:  focal right posterolateral lesion at the C5 level with no associated enhancement. 09/05/2015 MRI BRAIN & C-SPINE W WO:   mild regression of lesions in brain, including left brainstem and middle cerebellar penduncle, as well as spinal cord at level of C5.  06/12/2016 MRI CERVICAL SPINE W WO:  no new or progressed lesions.  02/02/2017 MRI BRAIN W WO:  no new, progressed or active lesions. 04/18/2018 MRI BRAIN & C-SPINE W WO:  Stable compared to prior images.  PAST MEDICAL HISTORY: Past Medical History:  Diagnosis Date  . Optic neuritis    R. approximately 2008    MEDICATIONS: Current Outpatient Medications on File Prior to Visit  Medication Sig Dispense Refill  . Dimethyl Fumarate (TECFIDERA) 120 MG CPDR Take 120 mg by mouth 2 (two) times daily. Starting dose. 14 capsule 0  . Dimethyl Fumarate (TECFIDERA) 240 MG CPDR Take 1 capsule (240 mg total) by mouth 2 (two) times daily. 60 capsule 11  . predniSONE (STERAPRED UNI-PAK 21 TAB) 10 MG (21) TBPK tablet As directed (Patient not taking: Reported on 03/16/2018) 21 tablet 0  . VUMERITY 231 MG CPDR TAKE 1 CAPSULE BY MOUTH 2 TIMES A DAY FOR 7 DAYS THEN 2 CAPSULES BY MOUTH 2 TIMES A DAY THEREAFTER. 106 capsule  0   No current facility-administered medications on file prior to visit.    ALLERGIES: No Known Allergies  FAMILY HISTORY: Family History  Problem Relation Age of Onset  . Cancer Maternal Grandmother        pancreatic   . Hypertension Paternal Grandmother   . Diabetes Neg  Hx   . Heart failure Neg Hx   . Hyperlipidemia Neg Hx    SOCIAL HISTORY: Social History   Socioeconomic History  . Marital status: Single    Spouse name: Not on file  . Number of children: Not on file  . Years of education: Not on file  . Highest education level: Not on file  Occupational History  . Not on file  Tobacco Use  . Smoking status: Never Smoker  . Smokeless tobacco: Never Used  Substance and Sexual Activity  . Alcohol use: Yes    Alcohol/week: 0.0 standard drinks    Comment: social   . Drug use: No  . Sexual activity: Yes    Partners: Female  Other Topics Concern  . Not on file  Social History Narrative  . Not on file   Social Determinants of Health   Financial Resource Strain:   . Difficulty of Paying Living Expenses: Not on file  Food Insecurity:   . Worried About Programme researcher, broadcasting/film/video in the Last Year: Not on file  . Ran Out of Food in the Last Year: Not on file  Transportation Needs:   . Lack of Transportation (Medical): Not on file  . Lack of Transportation (Non-Medical): Not on file  Physical Activity:   . Days of Exercise per Week: Not on file  . Minutes of Exercise per Session: Not on file  Stress:   . Feeling of Stress : Not on file  Social Connections:   . Frequency of Communication with Friends and Family: Not on file  . Frequency of Social Gatherings with Friends and Family: Not on file  . Attends Religious Services: Not on file  . Active Member of Clubs or Organizations: Not on file  . Attends Banker Meetings: Not on file  . Marital Status: Not on file  Intimate Partner Violence:   . Fear of Current or Ex-Partner: Not on file  . Emotionally Abused: Not on file  . Physically Abused: Not on file  . Sexually Abused: Not on file     PHYSICAL EXAM: Blood pressure 105/72, pulse 78, resp. rate 20, height 5\' 11"  (1.803 m), weight 157 lb (71.2 kg), SpO2 97 %. General: No acute distress.  Patient appears well-groomed.   Head:   Normocephalic/atraumatic Eyes:  Fundi examined but not visualized Neck: supple, no paraspinal tenderness, full range of motion Heart:  Regular rate and rhythm Lungs:  Clear to auscultation bilaterally Back: No paraspinal tenderness Neurological Exam: alert and oriented to person, place, and time. Attention span and concentration intact, recent and remote memory intact, fund of knowledge intact.  Speech fluent and not dysarthric, language intact.  CN II-XII intact. Bulk and tone normal, muscle strength 5/5 throughout.  Sensation to light touch, temperature and vibration intact.  Deep tendon reflexes 2+ throughout, toes downgoing.  Finger to nose and heel to shin testing intact.  Gait normal, able to turn and tandem walk.  Romberg negative.  IMPRESSION: Relapsing-remitting multiple sclerosis  PLAN: 1.  Vumerity 2.  D3 5000 IU daily 3.  Check CBC with diff, CMP, and vit D today and again in 6 months. 4.  Repeat  MRI of brain and cervical spine with and without contrast in 6 months. 5.  Follow up after testing (6 months)  Shon Millet, DO

## 2020-09-18 ENCOUNTER — Encounter: Payer: Self-pay | Admitting: Neurology

## 2020-09-18 ENCOUNTER — Ambulatory Visit: Payer: BC Managed Care – PPO | Admitting: Neurology

## 2020-09-18 ENCOUNTER — Other Ambulatory Visit: Payer: Self-pay

## 2020-09-18 ENCOUNTER — Other Ambulatory Visit (INDEPENDENT_AMBULATORY_CARE_PROVIDER_SITE_OTHER): Payer: BC Managed Care – PPO

## 2020-09-18 VITALS — BP 105/72 | HR 78 | Resp 20 | Ht 71.0 in | Wt 157.0 lb

## 2020-09-18 DIAGNOSIS — G35 Multiple sclerosis: Secondary | ICD-10-CM | POA: Diagnosis not present

## 2020-09-18 LAB — CBC
HCT: 44.6 % (ref 39.0–52.0)
Hemoglobin: 14.8 g/dL (ref 13.0–17.0)
MCHC: 33.2 g/dL (ref 30.0–36.0)
MCV: 84.5 fl (ref 78.0–100.0)
Platelets: 141 10*3/uL — ABNORMAL LOW (ref 150.0–400.0)
RBC: 5.28 Mil/uL (ref 4.22–5.81)
RDW: 14.1 % (ref 11.5–15.5)
WBC: 3.3 10*3/uL — ABNORMAL LOW (ref 4.0–10.5)

## 2020-09-18 LAB — COMPREHENSIVE METABOLIC PANEL
ALT: 27 U/L (ref 0–53)
AST: 24 U/L (ref 0–37)
Albumin: 4.4 g/dL (ref 3.5–5.2)
Alkaline Phosphatase: 71 U/L (ref 39–117)
BUN: 17 mg/dL (ref 6–23)
CO2: 30 mEq/L (ref 19–32)
Calcium: 9.1 mg/dL (ref 8.4–10.5)
Chloride: 103 mEq/L (ref 96–112)
Creatinine, Ser: 0.97 mg/dL (ref 0.40–1.50)
GFR: 101.11 mL/min (ref >60.00)
Glucose, Bld: 88 mg/dL (ref 70–99)
Potassium: 3.9 mEq/L (ref 3.5–5.1)
Sodium: 140 mEq/L (ref 135–145)
Total Bilirubin: 0.6 mg/dL (ref 0.2–1.2)
Total Protein: 7.2 g/dL (ref 6.0–8.3)

## 2020-09-18 LAB — VITAMIN D 25 HYDROXY (VIT D DEFICIENCY, FRACTURES): VITD: 78.23 ng/mL (ref 30.00–100.00)

## 2020-09-18 NOTE — Addendum Note (Signed)
Addended by: Allean Found R on: 09/18/2020 01:04 PM   Modules accepted: Orders

## 2020-09-18 NOTE — Patient Instructions (Signed)
1.  Continue Vumerity 2.  D3 5000 IU daily 3.  Take CBC with diff, CMP and vitamin D level today and again in 6 months 4.  MRI of brain and cervical spine with and without contrast in 6 months 5.  Follow up in 6 months (after repeat testing).

## 2020-09-19 ENCOUNTER — Other Ambulatory Visit: Payer: Self-pay

## 2020-09-19 DIAGNOSIS — G35 Multiple sclerosis: Secondary | ICD-10-CM

## 2020-09-19 LAB — WHITE CELL DIFFERENTIAL
Band Neutrophils: 0 %
Basophils Relative: 1.3 % (ref 0.0–3.0)
Eosinophils Relative: 2.1 % (ref 0.0–5.0)
Lymphocytes Relative: 18.2 % (ref 12.0–46.0)
Monocytes Relative: 11.5 % (ref 3.0–12.0)
Neutrophils Relative %: 66.9 % (ref 43.0–77.0)

## 2020-09-22 ENCOUNTER — Other Ambulatory Visit: Payer: Self-pay | Admitting: Neurology

## 2021-03-13 ENCOUNTER — Encounter: Payer: Self-pay | Admitting: Neurology

## 2021-03-13 NOTE — Progress Notes (Signed)
Received fax approval valid from 03/13/21 to 03/13/22 for Vumerity medication. Sent to scanning for chart.

## 2021-03-15 ENCOUNTER — Ambulatory Visit
Admission: RE | Admit: 2021-03-15 | Discharge: 2021-03-15 | Disposition: A | Discharge status: Home / Self Care | Payer: BC Managed Care – PPO | Source: Ambulatory Visit | Attending: Neurology | Admitting: Neurology

## 2021-03-15 ENCOUNTER — Other Ambulatory Visit: Payer: Self-pay

## 2021-03-15 DIAGNOSIS — G35 Multiple sclerosis: Secondary | ICD-10-CM

## 2021-03-15 MED ORDER — GADOBENATE DIMEGLUMINE 529 MG/ML IV SOLN
15.0000 mL | Freq: Once | INTRAVENOUS | Status: AC | PRN
  Administered 2021-03-15: 15 mL via INTRAVENOUS

## 2021-03-19 NOTE — Progress Notes (Signed)
NEUROLOGY FOLLOW UP OFFICE NOTE  Howard Parker [id number]  Assessment/Plan:   1.  Multiple sclerosis  1.  DMT:  Vumerity.  Advised to not take with high fat-high caloric food. 2.  D3 5000 IU daily 3.  Check CBC with diff, LFT and vit D today and repeat again in 6 months. 4.  Follow up in 6 months.  Subjective:  Howard Parker a 35 year old right-handed African-American male who follows up for multiple sclerosis.  UPDATE: Current disease modifying therapy: Vumerity Other current medications: None. Takes D3 5000 IU daily.  MRI of brain and cervical spine with and without contrast on 03/15/2021 personally reviewed stable compared to prior imaging from 04/18/2018.   He is doing well. 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 Motor: No issues Sensory: No issues Pain: no issues Gait: No issues Bowel/Bladder: No issues Fatigue: No issues Cognition: No issues Mood: No issues   HISTORY: He has a remote history of right optic neuritisin his early 24s.MRI of the brain was reportedly unremarkable.He did not have an LP.He was treated with IV Solumedrol and has not had any recurrence.He still has some minor residual vision loss in the right eye.  In February 2016, he developed left facial numbness associated with pressure around the left eye.He went to a physician and was prescribed antibiotics for presumed sinusitis.In March, he woke up with numbness involving the right upper and lower extremities.There was no associated pain or weakness.At first, he thought he just slept on it but the numbness persisted.He went to urgent care.He was prescribed a prednisone taper and referred for neurology evaluation.He noted tingling sensation around the left eye, cheek and left side of mouth in the face.It also involved the entire right arm from the right side of the neck to the hand.He  notes the tingling from the right calf down to the foot.He denies any visual changes or headache.MRI of the brain with and without contrast performed on 02/01/15 showed 3 demyelinating lesions (in the pons, anterior right frontal lobe and left temporal lobe) with no enhancement.MRI of the cervical spine showed focal right posterolateral lesion at the C5 level with no associated enhancement. He underwent 3 days of 1gm of Solu-Medrol.  In January 2019, he had episode of dizziness and unsteady gait lasting 2 weeks, which was treated with prednisone taper.  He gets his eyes checked regularly.He denies family history of demyelinating disease.  He is a Editor, commissioning.  Imaging: 02/01/2015 MRI BRAIN W WO:  3 demyelinating lesions (in the pons, anterior right frontal lobe and left temporal lobe) with no enhancement. 02/01/2015 MRI CERVICAL SPINE W WO:  focal right posterolateral lesion at the C5 level with no associated enhancement. 09/05/2015 MRI BRAIN & C-SPINE W WO:   mild regression of lesions in brain, including left brainstem and middle cerebellar penduncle, as well as spinal cord at level of C5.  06/12/2016 MRI CERVICAL SPINE W WO:  no new or progressed lesions.  02/02/2017 MRI BRAIN W WO:  no new, progressed or active lesions. 04/18/2018 MRI BRAIN & C-SPINE W WO:  Stable compared to prior images.   PAST MEDICAL HISTORY: Past Medical History:  Diagnosis Date  . Optic neuritis    R. approximately 2008    MEDICATIONS: Current Outpatient Medications on File Prior to Visit  Medication Sig Dispense Refill  . predniSONE (STERAPRED UNI-PAK 21 TAB) 10 MG (21) TBPK tablet As directed (Patient not taking:  Reported on 03/16/2018) 21 tablet 0  . VUMERITY 231 MG CPDR TAKE 2 CAPSULES BY MOUTH 2 TIMES A DAY. 120 capsule 4   No current facility-administered medications on file prior to visit.    ALLERGIES: No Known Allergies  FAMILY HISTORY: Family History  Problem Relation Age of  Onset  . Cancer Maternal Grandmother        pancreatic   . Hypertension Paternal Grandmother   . Diabetes Neg Hx   . Heart failure Neg Hx   . Hyperlipidemia Neg Hx       Objective:  Blood pressure 103/68, pulse 92, height 5\' 11"  (1.803 m), weight 160 lb (72.6 kg), SpO2 97 %. General: No acute distress.  Patient appears well-groomed.   Head:  Normocephalic/atraumatic Eyes:  Fundi examined but not visualized Neck: supple, no paraspinal tenderness, full range of motion Heart:  Regular rate and rhythm Lungs:  Clear to auscultation bilaterally Back: No paraspinal tenderness Neurological Exam: alert and oriented to person, place, and time. Speech fluent and not dysarthric, language intact.  CN II-XII intact. Bulk and tone normal, muscle strength 5/5 throughout.  Sensation to light touch, temperature and vibration intact.  Deep tendon reflexes 2+ throughout, toes downgoing.  Finger to nose and heel to shin testing intact.  Gait normal, Romberg negative.     , DO

## 2021-03-20 ENCOUNTER — Other Ambulatory Visit (INDEPENDENT_AMBULATORY_CARE_PROVIDER_SITE_OTHER): Payer: BC Managed Care – PPO

## 2021-03-20 ENCOUNTER — Ambulatory Visit (INDEPENDENT_AMBULATORY_CARE_PROVIDER_SITE_OTHER): Payer: BC Managed Care – PPO | Admitting: Neurology

## 2021-03-20 ENCOUNTER — Other Ambulatory Visit: Payer: Self-pay

## 2021-03-20 ENCOUNTER — Encounter: Payer: Self-pay | Admitting: Neurology

## 2021-03-20 VITALS — BP 103/68 | HR 92 | Ht 71.0 in | Wt 160.0 lb

## 2021-03-20 DIAGNOSIS — G35 Multiple sclerosis: Secondary | ICD-10-CM

## 2021-03-20 DIAGNOSIS — G44219 Episodic tension-type headache, not intractable: Secondary | ICD-10-CM

## 2021-03-20 LAB — CBC WITH DIFFERENTIAL/PLATELET
Basophils Absolute: 0 10*3/uL (ref 0.0–0.1)
Basophils Relative: 0.6 % (ref 0.0–3.0)
Eosinophils Absolute: 0.1 10*3/uL (ref 0.0–0.7)
Eosinophils Relative: 2.3 % (ref 0.0–5.0)
HCT: 44.1 % (ref 39.0–52.0)
Hemoglobin: 14.4 g/dL (ref 13.0–17.0)
Lymphocytes Relative: 15.9 % (ref 12.0–46.0)
Lymphs Abs: 0.5 10*3/uL — ABNORMAL LOW (ref 0.7–4.0)
MCHC: 32.6 g/dL (ref 30.0–36.0)
MCV: 84 fl (ref 78.0–100.0)
Monocytes Absolute: 0.3 10*3/uL (ref 0.1–1.0)
Monocytes Relative: 9 % (ref 3.0–12.0)
Neutro Abs: 2.2 10*3/uL (ref 1.4–7.7)
Neutrophils Relative %: 72.2 % (ref 43.0–77.0)
Platelets: 136 10*3/uL — ABNORMAL LOW (ref 150.0–400.0)
RBC: 5.25 Mil/uL (ref 4.22–5.81)
RDW: 14.4 % (ref 11.5–15.5)
WBC: 3 10*3/uL — ABNORMAL LOW (ref 4.0–10.5)

## 2021-03-20 LAB — VITAMIN D 25 HYDROXY (VIT D DEFICIENCY, FRACTURES): VITD: 79.72 ng/mL (ref 30.00–100.00)

## 2021-03-20 NOTE — Patient Instructions (Addendum)
1.  Continue Vumerity - do not take with high fat-high caloric food (over 700 cal) 2.  D3 5000 IU daily 3.  Check cbc with diff, LFTs and vit D today and again in 6 months 4.  Follow up in 6 months.   Your provider has requested that you have labwork completed today. Please go to [facility] ([id number]) on the second floor of this building before leaving the office today. You do not need to check in. If you are not called within 15 minutes please check with the front desk.

## 2021-03-21 LAB — HEPATIC FUNCTION PANEL (6)
ALT: 35 IU/L (ref 0–44)
AST: 29 IU/L (ref 0–40)
Albumin: 4.3 g/dL (ref 4.0–5.0)
Alkaline Phosphatase: 82 IU/L (ref 44–121)
Bilirubin Total: 0.4 mg/dL (ref 0.0–1.2)
Bilirubin, Direct: 0.13 mg/dL (ref 0.00–0.40)

## 2021-03-21 NOTE — Progress Notes (Signed)
Pt advised of Lab results.

## 2021-03-30 ENCOUNTER — Other Ambulatory Visit: Payer: Self-pay | Admitting: Neurology

## 2021-10-01 ENCOUNTER — Other Ambulatory Visit: Payer: Self-pay | Admitting: Neurology

## 2021-10-01 NOTE — Telephone Encounter (Signed)
Pt advised to schedule a visit and have labs a week prior due 03/2022.  Pt transferred to the front desk to schedule

## 2022-02-27 ENCOUNTER — Other Ambulatory Visit (HOSPITAL_COMMUNITY): Payer: Self-pay

## 2022-03-01 ENCOUNTER — Other Ambulatory Visit (HOSPITAL_COMMUNITY): Payer: Self-pay

## 2022-03-06 NOTE — Progress Notes (Signed)
PA Approved for Vumertiy 03/05/22-03/06/23 ?

## 2022-04-25 ENCOUNTER — Other Ambulatory Visit: Payer: Self-pay | Admitting: Neurology

## 2022-04-29 NOTE — Progress Notes (Unsigned)
NEUROLOGY FOLLOW UP OFFICE NOTE  Howard Parker [id number]  Assessment/Plan:   Multiple sclerosis, stable   1.  DMT:  Vumerity.  Advised to not take with high fat-high caloric food. 2.  D3 5000 IU daily 3.  Check CBC with diff, LFT and vit D today and repeat again in 6 months. 4.  Repeat MRI of brain and C-spine with and without contrast in one year 5.  Follow up in one year   Subjective:  Howard Parker is a 36 year old right-handed African-American male who follows up for multiple sclerosis.   UPDATE: Current disease modifying therapy:  Vumerity Other current medications:  None.  Takes D3 5000 IU daily.     He is doing well. 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 Motor:  No issues Sensory:  No issues Pain:  no issues Gait:  No issues Bowel/Bladder:  No issues Fatigue:  No issues Cognition:  No issues Mood:  No issues     HISTORY: He has a remote history of right optic neuritis in his early 54s.  MRI of the brain was reportedly unremarkable.  He did not have an LP.  He was treated with IV Solumedrol and has not had any recurrence.  He still has some minor residual vision loss in the right eye.    In February 2016, he developed left facial numbness associated with pressure around the left eye.  He went to a physician and was prescribed antibiotics for presumed sinusitis.  In March, he woke up with numbness involving the right upper and lower extremities.  There was no associated pain or weakness.  At first, he thought he just slept on it but the numbness persisted.  He went to urgent care.  He was prescribed a prednisone taper and referred for neurology evaluation.  He noted tingling sensation around the left eye, cheek and left side of mouth in the face.  It also involved the entire right arm from the right side of the neck to the hand.  He notes the tingling from the right calf down to the foot.  He  denies any visual changes or headache.  MRI of the brain with and without contrast performed on 02/01/15 showed 3 demyelinating lesions (in the pons, anterior right frontal lobe and left temporal lobe) with no enhancement.  MRI of the cervical spine showed focal right posterolateral lesion at the C5 level with no associated enhancement. He underwent 3 days of 1gm of Solu-Medrol.   In January 2019, he had episode of dizziness and unsteady gait lasting 2 weeks, which was treated with prednisone taper.    He gets his eyes checked regularly.  He denies family history of demyelinating disease.   He is a Music therapist.   Imaging: 02/01/2015 MRI BRAIN W WO:  3 demyelinating lesions (in the pons, anterior right frontal lobe and left temporal lobe) with no enhancement.  02/01/2015 MRI CERVICAL SPINE W WO:  focal right posterolateral lesion at the C5 level with no associated enhancement. 09/05/2015 MRI BRAIN & C-SPINE W WO:   mild regression of lesions in brain, including left brainstem and middle cerebellar penduncle, as well as spinal cord at level of C5.  06/12/2016 MRI CERVICAL SPINE W WO:  no new or progressed lesions.  02/02/2017 MRI BRAIN W WO:  no new, progressed or active lesions. 04/18/2018 MRI BRAIN & C-SPINE W WO:  Stable compared to prior  images. 03/15/2021 MRI BRAIN & C-SPINE W WO:  Stable compared to 04/18/2018.  PAST MEDICAL HISTORY: Past Medical History:  Diagnosis Date   Optic neuritis    R. approximately 2008    MEDICATIONS: Current Outpatient Medications on File Prior to Visit  Medication Sig Dispense Refill   predniSONE (STERAPRED UNI-PAK 21 TAB) 10 MG (21) TBPK tablet As directed (Patient not taking: No sig reported) 21 tablet 0   VUMERITY 231 MG CPDR TAKE 2 CAPSULES BY MOUTH 2 TIMES A DAY. 120 capsule 5   No current facility-administered medications on file prior to visit.    ALLERGIES: No Known Allergies  FAMILY HISTORY: Family History  Problem Relation Age of Onset    Cancer Maternal Grandmother        pancreatic    Hypertension Paternal Grandmother    Diabetes Neg Hx    Heart failure Neg Hx    Hyperlipidemia Neg Hx       Objective:  Blood pressure 112/73, pulse (!) 105, height 6' (1.829 m), weight 157 lb 12.8 oz (71.6 kg), SpO2 98 %. General: No acute distress.  Patient appears well-groomed.   Head:  Normocephalic/atraumatic Eyes:  Fundi examined but not visualized Neck: supple, no paraspinal tenderness, full range of motion Heart:  Regular rate and rhythm Lungs:  Clear to auscultation bilaterally Back: No paraspinal tenderness Neurological Exam: alert and oriented to person, place, and time.  Speech fluent and not dysarthric, language intact.  CN II-XII intact. Bulk and tone normal, muscle strength 5/5 throughout.  Sensation to light touch, pinprick and vibration intact intact.  Deep tendon reflexes 2+ throughout, toes downgoing.  Finger to nose testing intact.  Gait normal, Romberg negative.   Metta Clines, DO

## 2022-04-30 ENCOUNTER — Other Ambulatory Visit (INDEPENDENT_AMBULATORY_CARE_PROVIDER_SITE_OTHER): Payer: BC Managed Care – PPO

## 2022-04-30 ENCOUNTER — Ambulatory Visit: Payer: BC Managed Care – PPO | Admitting: Neurology

## 2022-04-30 ENCOUNTER — Encounter: Payer: Self-pay | Admitting: Neurology

## 2022-04-30 VITALS — BP 112/73 | HR 105 | Ht 72.0 in | Wt 157.8 lb

## 2022-04-30 DIAGNOSIS — G35 Multiple sclerosis: Secondary | ICD-10-CM | POA: Diagnosis not present

## 2022-04-30 LAB — CBC WITH DIFFERENTIAL/PLATELET
Basophils Absolute: 0 10*3/uL (ref 0.0–0.1)
Basophils Relative: 0.3 % (ref 0.0–3.0)
Eosinophils Absolute: 0.1 10*3/uL (ref 0.0–0.7)
Eosinophils Relative: 1.2 % (ref 0.0–5.0)
HCT: 42.6 % (ref 39.0–52.0)
Hemoglobin: 13.9 g/dL (ref 13.0–17.0)
Lymphocytes Relative: 13.3 % (ref 12.0–46.0)
Lymphs Abs: 0.6 10*3/uL — ABNORMAL LOW (ref 0.7–4.0)
MCHC: 32.7 g/dL (ref 30.0–36.0)
MCV: 84.1 fl (ref 78.0–100.0)
Monocytes Absolute: 0.6 10*3/uL (ref 0.1–1.0)
Monocytes Relative: 12.3 % — ABNORMAL HIGH (ref 3.0–12.0)
Neutro Abs: 3.4 10*3/uL (ref 1.4–7.7)
Neutrophils Relative %: 72.9 % (ref 43.0–77.0)
Platelets: 142 10*3/uL — ABNORMAL LOW (ref 150.0–400.0)
RBC: 5.07 Mil/uL (ref 4.22–5.81)
RDW: 15.2 % (ref 11.5–15.5)
WBC: 4.7 10*3/uL (ref 4.0–10.5)

## 2022-04-30 LAB — HEPATIC FUNCTION PANEL
ALT: 16 U/L (ref 0–53)
AST: 18 U/L (ref 0–37)
Albumin: 4.3 g/dL (ref 3.5–5.2)
Alkaline Phosphatase: 77 U/L (ref 39–117)
Bilirubin, Direct: 0.1 mg/dL (ref 0.0–0.3)
Total Bilirubin: 0.5 mg/dL (ref 0.2–1.2)
Total Protein: 6.9 g/dL (ref 6.0–8.3)

## 2022-04-30 LAB — VITAMIN D 25 HYDROXY (VIT D DEFICIENCY, FRACTURES): VITD: 75.57 ng/mL (ref 30.00–100.00)

## 2022-04-30 NOTE — Patient Instructions (Addendum)
Vumerity D3 5000 IU daily Check CBC with diff, hepatic panel, vit D level today and again in 6 months Repeat MRI of brain and cervical spine with and without contrast in one year. Follow up in one year after the repeat MRI.

## 2022-05-17 ENCOUNTER — Other Ambulatory Visit: Payer: Self-pay | Admitting: Neurology

## 2022-05-20 ENCOUNTER — Telehealth: Payer: Self-pay | Admitting: Neurology

## 2022-05-20 NOTE — Telephone Encounter (Signed)
Dorene Grebe from [facility] called and stated he is out of refills for vumerity.

## 2022-05-21 MED ORDER — VUMERITY 231 MG PO CPDR: DELAYED_RELEASE_CAPSULE | ORAL | 5 refills | Status: DC

## 2022-05-21 NOTE — Telephone Encounter (Signed)
Refills sent

## 2022-10-22 ENCOUNTER — Telehealth: Payer: Self-pay

## 2022-10-22 NOTE — Telephone Encounter (Signed)
Called and left  pt to inform him to make an appointment for Labs

## 2022-10-23 ENCOUNTER — Telehealth: Payer: Self-pay

## 2022-10-23 ENCOUNTER — Other Ambulatory Visit: Payer: Self-pay

## 2022-10-23 DIAGNOSIS — G35 Multiple sclerosis: Secondary | ICD-10-CM

## 2022-10-23 DIAGNOSIS — G44219 Episodic tension-type headache, not intractable: Secondary | ICD-10-CM

## 2022-10-23 NOTE — Telephone Encounter (Signed)
Pt called back and is aware of Labs.

## 2022-10-29 ENCOUNTER — Other Ambulatory Visit (INDEPENDENT_AMBULATORY_CARE_PROVIDER_SITE_OTHER): Payer: BC Managed Care – PPO

## 2022-10-29 DIAGNOSIS — G44219 Episodic tension-type headache, not intractable: Secondary | ICD-10-CM

## 2022-10-29 DIAGNOSIS — G35 Multiple sclerosis: Secondary | ICD-10-CM

## 2022-10-29 LAB — HEPATIC FUNCTION PANEL
ALT: 21 U/L (ref 0–53)
AST: 20 U/L (ref 0–37)
Albumin: 4.5 g/dL (ref 3.5–5.2)
Alkaline Phosphatase: 65 U/L (ref 39–117)
Bilirubin, Direct: 0.1 mg/dL (ref 0.0–0.3)
Total Bilirubin: 0.8 mg/dL (ref 0.2–1.2)
Total Protein: 7.1 g/dL (ref 6.0–8.3)

## 2022-10-29 LAB — VITAMIN D 25 HYDROXY (VIT D DEFICIENCY, FRACTURES): VITD: 106.55 ng/mL (ref 30.00–100.00)

## 2022-10-30 ENCOUNTER — Telehealth: Payer: Self-pay | Admitting: Neurology

## 2022-10-30 LAB — CBC WITH DIFFERENTIAL
Basophils Absolute: 0 10*3/uL (ref 0.0–0.2)
Basos: 1 %
EOS (ABSOLUTE): 0.1 10*3/uL (ref 0.0–0.4)
Eos: 2 %
Hematocrit: 44.8 % (ref 37.5–51.0)
Hemoglobin: 14.6 g/dL (ref 13.0–17.7)
Immature Grans (Abs): 0 10*3/uL (ref 0.0–0.1)
Immature Granulocytes: 0 %
Lymphocytes Absolute: 0.8 10*3/uL (ref 0.7–3.1)
Lymphs: 20 %
MCH: 27.2 pg (ref 26.6–33.0)
MCHC: 32.6 g/dL (ref 31.5–35.7)
MCV: 83 fL (ref 79–97)
Monocytes Absolute: 0.5 10*3/uL (ref 0.1–0.9)
Monocytes: 11 %
Neutrophils Absolute: 2.7 10*3/uL (ref 1.4–7.0)
Neutrophils: 66 %
RBC: 5.37 x10E6/uL (ref 4.14–5.80)
RDW: 13.8 % (ref 11.6–15.4)
WBC: 4.1 10*3/uL (ref 3.4–10.8)

## 2022-10-30 NOTE — Telephone Encounter (Signed)
Howard Parker with labs called and left a mess with the AN with a critical lab to report. Vitamin D @ 106.55 collected this AM  @ 11:45

## 2022-10-30 NOTE — Telephone Encounter (Signed)
noted 

## 2022-10-31 ENCOUNTER — Other Ambulatory Visit: Payer: Self-pay | Admitting: Neurology

## 2022-11-22 ENCOUNTER — Other Ambulatory Visit: Payer: Self-pay | Admitting: Neurology

## 2023-02-21 ENCOUNTER — Other Ambulatory Visit (HOSPITAL_COMMUNITY): Payer: Self-pay

## 2023-02-21 ENCOUNTER — Telehealth: Payer: Self-pay | Admitting: Pharmacy Technician

## 2023-02-21 NOTE — Telephone Encounter (Signed)
Patient Advocate Encounter  Received notification from [facility] that prior authorization for VUMERITY 231MG  is required.   PA submitted on 3.22.24 Key [id number] Status is pending

## 2023-02-24 NOTE — Telephone Encounter (Signed)
Patient Advocate Encounter  Prior Authorization for VUMERITY has been approved.    PA# [id number] Effective dates: 3.22.24 through 3.22.25

## 2023-03-04 ENCOUNTER — Telehealth: Payer: Self-pay

## 2023-03-04 ENCOUNTER — Other Ambulatory Visit: Payer: Self-pay

## 2023-03-04 DIAGNOSIS — G44219 Episodic tension-type headache, not intractable: Secondary | ICD-10-CM

## 2023-03-04 DIAGNOSIS — G35 Multiple sclerosis: Secondary | ICD-10-CM

## 2023-03-04 NOTE — Telephone Encounter (Signed)
Made order for Mri Cervical Spine per Dr. Tomi Likens. Called pt and left message on voice mail to make him aware of this.

## 2023-03-25 NOTE — Telephone Encounter (Signed)
Called pt and left message about the okay Per DrEverlena Cooper to change appointment to summer.

## 2023-05-02 ENCOUNTER — Ambulatory Visit: Payer: BC Managed Care – PPO | Admitting: Neurology

## 2023-05-15 ENCOUNTER — Other Ambulatory Visit: Payer: Self-pay | Admitting: Neurology

## 2023-05-16 ENCOUNTER — Other Ambulatory Visit: Payer: Self-pay

## 2023-05-16 DIAGNOSIS — G35 Multiple sclerosis: Secondary | ICD-10-CM

## 2023-05-16 NOTE — Telephone Encounter (Signed)
Called patient and he will be coming in on Monday 05/19/23 to have labs drawn.

## 2023-05-19 ENCOUNTER — Other Ambulatory Visit (INDEPENDENT_AMBULATORY_CARE_PROVIDER_SITE_OTHER): Payer: BC Managed Care – PPO

## 2023-05-19 DIAGNOSIS — G35 Multiple sclerosis: Secondary | ICD-10-CM | POA: Diagnosis not present

## 2023-05-19 LAB — CBC WITH DIFFERENTIAL/PLATELET
Basophils Absolute: 0 10*3/uL (ref 0.0–0.1)
Basophils Relative: 0.7 % (ref 0.0–3.0)
Eosinophils Absolute: 0.1 10*3/uL (ref 0.0–0.7)
Eosinophils Relative: 2.8 % (ref 0.0–5.0)
HCT: 44.5 % (ref 39.0–52.0)
Hemoglobin: 14.3 g/dL (ref 13.0–17.0)
Lymphocytes Relative: 18.9 % (ref 12.0–46.0)
Lymphs Abs: 0.7 10*3/uL (ref 0.7–4.0)
MCHC: 32.1 g/dL (ref 30.0–36.0)
MCV: 84.7 fl (ref 78.0–100.0)
Monocytes Absolute: 0.4 10*3/uL (ref 0.1–1.0)
Monocytes Relative: 12.7 % — ABNORMAL HIGH (ref 3.0–12.0)
Neutro Abs: 2.3 10*3/uL (ref 1.4–7.7)
Neutrophils Relative %: 64.9 % (ref 43.0–77.0)
Platelets: 154 10*3/uL (ref 150.0–400.0)
RBC: 5.26 Mil/uL (ref 4.22–5.81)
RDW: 14.7 % (ref 11.5–15.5)
WBC: 3.5 10*3/uL — ABNORMAL LOW (ref 4.0–10.5)

## 2023-09-05 NOTE — Progress Notes (Unsigned)
NEUROLOGY FOLLOW UP OFFICE NOTE  Howard Parker [id number]  Assessment/Plan:   Multiple sclerosis, stable   1.  DMT:  Vumerity.  Advised to not take with high fat-high caloric food. 2.  D3 5000 IU daily 3.  Check CBC with diff, LFT and vit D today and repeat again in 6 months. 4.  Repeat MRI of brain and C-spine with and without contrast in one year 5.  Follow up in one year   Subjective:  Howard Parker is a 37 year old right-handed African-American male who follows up for multiple sclerosis.   UPDATE: Current disease modifying therapy:  Vumerity Other current medications:  None.  Takes D3 5000 IU daily.  05/19/2023 CBC with WBC 3.5, HGB 14.3, HCT 44.5, PLT 154, ALC 0.7 K/uL     He is doing well. 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 Motor:  No issues Sensory:  No issues Pain:  no issues Gait:  No issues Bowel/Bladder:  No issues Fatigue:  No issues Cognition:  No issues Mood:  No issues     HISTORY: He has a remote history of right optic neuritis in his early 56s.  MRI of the brain was reportedly unremarkable.  He did not have an LP.  He was treated with IV Solumedrol and has not had any recurrence.  He still has some minor residual vision loss in the right eye.    In February 2016, he developed left facial numbness associated with pressure around the left eye.  He went to a physician and was prescribed antibiotics for presumed sinusitis.  In March, he woke up with numbness involving the right upper and lower extremities.  There was no associated pain or weakness.  At first, he thought he just slept on it but the numbness persisted.  He went to urgent care.  He was prescribed a prednisone taper and referred for neurology evaluation.  He noted tingling sensation around the left eye, cheek and left side of mouth in the face.  It also involved the entire right arm from the right side of the neck to the  hand.  He notes the tingling from the right calf down to the foot.  He denies any visual changes or headache.  MRI of the brain with and without contrast performed on 02/01/15 showed 3 demyelinating lesions (in the pons, anterior right frontal lobe and left temporal lobe) with no enhancement.  MRI of the cervical spine showed focal right posterolateral lesion at the C5 level with no associated enhancement. He underwent 3 days of 1gm of Solu-Medrol.   In January 2019, he had episode of dizziness and unsteady gait lasting 2 weeks, which was treated with prednisone taper.    He gets his eyes checked regularly.  He denies family history of demyelinating disease.   He is a Editor, commissioning.   Imaging: 02/01/2015 MRI BRAIN W WO:  3 demyelinating lesions (in the pons, anterior right frontal lobe and left temporal lobe) with no enhancement.  02/01/2015 MRI CERVICAL SPINE W WO:  focal right posterolateral lesion at the C5 level with no associated enhancement. 09/05/2015 MRI BRAIN & C-SPINE W WO:   mild regression of lesions in brain, including left brainstem and middle cerebellar penduncle, as well as spinal cord at level of C5.  06/12/2016 MRI CERVICAL SPINE W WO:  no new or progressed lesions.  02/02/2017 MRI BRAIN W WO:  no new, progressed  or active lesions. 04/18/2018 MRI BRAIN & C-SPINE W WO:  Stable compared to prior images. 03/15/2021 MRI BRAIN & C-SPINE W WO:  Stable compared to 04/18/2018.  PAST MEDICAL HISTORY: Past Medical History:  Diagnosis Date   Optic neuritis    R. approximately 2008    MEDICATIONS: Current Outpatient Medications on File Prior to Visit  Medication Sig Dispense Refill   predniSONE (STERAPRED UNI-PAK 21 TAB) 10 MG (21) TBPK tablet As directed (Patient not taking: No sig reported) 21 tablet 0   VUMERITY 231 MG CPDR TAKE 2 CAPSULES BY MOUTH 2 TIMES A DAY 360 capsule 1   No current facility-administered medications on file prior to visit.    ALLERGIES: No Known  Allergies  FAMILY HISTORY: Family History  Problem Relation Age of Onset   Cancer Maternal Grandmother        pancreatic    Hypertension Paternal Grandmother    Diabetes Neg Hx    Heart failure Neg Hx    Hyperlipidemia Neg Hx       Objective:  *** General: No acute distress.  Patient appears well-groomed.   Head:  Normocephalic/atraumatic Eyes:  Fundi examined but not visualized Neck: supple, no paraspinal tenderness, full range of motion Heart:  Regular rate and rhythm Neurological Exam: alert and oriented.  Speech fluent and not dysarthric, language intact.  CN II-XII intact. Bulk and tone normal, muscle strength 5/5 throughout.  Sensation to light touch intact.  Deep tendon reflexes 2+ throughout.  Finger to nose testing intact.  Gait normal, Romberg negative.   Shon Millet, DO

## 2023-09-08 ENCOUNTER — Encounter: Payer: Self-pay | Admitting: Neurology

## 2023-09-08 ENCOUNTER — Ambulatory Visit: Payer: BC Managed Care – PPO | Admitting: Neurology

## 2023-09-08 VITALS — BP 123/78 | HR 86 | Ht 72.0 in | Wt 161.2 lb

## 2023-09-08 DIAGNOSIS — G35 Multiple sclerosis: Secondary | ICD-10-CM | POA: Diagnosis not present

## 2023-09-08 NOTE — Patient Instructions (Addendum)
Help finding a primary care provider within . If you do not have access to a computer or the Internet you can call [phone] and  they will help you scheduled with a provider.  Or you can go to: [email or web address]    Continue Vumerity Continue Vit D - may consider taking 4000 units daily Check CBC with diff and vit D level in 6 months Check MRI of brain and cervical spine with and without contrast Follow up in one year or as needed.

## 2023-09-08 NOTE — Addendum Note (Signed)
Addended by: Leida Lauth on: 09/08/2023 01:54 PM   Modules accepted: Orders

## 2023-11-04 ENCOUNTER — Other Ambulatory Visit: Payer: Self-pay | Admitting: Neurology

## 2023-11-24 ENCOUNTER — Ambulatory Visit
Admission: RE | Admit: 2023-11-24 | Discharge: 2023-11-24 | Disposition: A | Discharge status: Home / Self Care | Payer: BC Managed Care – PPO | Source: Ambulatory Visit | Attending: Neurology | Admitting: Neurology

## 2023-11-24 DIAGNOSIS — G35 Multiple sclerosis: Secondary | ICD-10-CM

## 2023-11-24 MED ORDER — GADOPICLENOL 0.5 MMOL/ML IV SOLN
7.5000 mL | Freq: Once | INTRAVENOUS | Status: AC | PRN
  Administered 2023-11-24: 7.5 mL via INTRAVENOUS

## 2023-11-27 ENCOUNTER — Other Ambulatory Visit: Payer: Self-pay

## 2023-11-27 MED ORDER — VUMERITY 231 MG PO CPDR: DELAYED_RELEASE_CAPSULE | ORAL | 2 refills | Status: DC

## 2023-11-27 NOTE — Telephone Encounter (Signed)
[facility], Refills needed for Vumerty 231 mg take 2 cap by mouth BID.  Refills sent

## 2024-02-23 ENCOUNTER — Telehealth: Payer: Self-pay | Admitting: Pharmacy Technician

## 2024-02-23 NOTE — Telephone Encounter (Signed)
 Pharmacy Patient Advocate Encounter   Received notification from CoverMyMeds that prior authorization for VUMERITY is required/requested.   Insurance verification completed.   The patient is insured through [facility] .   Per test claim: PA required; PA submitted to above mentioned insurance via CoverMyMeds Key/confirmation #/[id number] Status is pending

## 2024-03-01 NOTE — Telephone Encounter (Signed)
 Pharmacy Patient Advocate Encounter  Received notification from [facility] that Prior Authorization for VUMERITY has been DENIED.  Full denial letter will be uploaded to the media tab. See denial reason below.  Your plan only covers this drug when you meet one of these options: A) You have tried other drugs your plan covers (preferred drugs), and they did not work well for you, or B) Your doctor gives [place] a medical reason you cannot take those other drugs. For your plan, you may need to try three preferred drugs including a trial of dimethyl fumarate or Bafiertam. Your records must be provided and must be provide and must show what your doctor tells [place]. We have denied your request because you do not meet any of these conditions. We reviewed the information we had. The preferred drugs for your plan are: A) Avonex, B) Bafiertam, C) Betaseron, D) Copaxone 40 mg, E) Glatopa, I) Kesimpta, J) Mayzent, K) Ocrevus, L) Rebif, M) generic teriflunomide, N) Tysabri, and O) Zeposia. Your doctor may need to get approval from your plan for preferred drugs. For this drug, you may have to meet other criteria.   PA #/Case ID/Reference #: [id number]

## 2024-03-02 NOTE — Telephone Encounter (Signed)
 We can switch back to dimethyl fumarate which is generic Tecfidera.  We had to switch because back then, his insurance wouldn't cover it.  We can go ahead and start dimethyl fumarate 240mg  twice daily.    Tried calling patient no answer.

## 2024-03-04 ENCOUNTER — Other Ambulatory Visit: Payer: Self-pay | Admitting: Neurology

## 2024-03-04 DIAGNOSIS — G35 Multiple sclerosis: Secondary | ICD-10-CM

## 2024-03-04 MED ORDER — DIMETHYL FUMARATE 240 MG PO CPDR: 240.0000 mg | DELAYED_RELEASE_CAPSULE | Freq: Two times a day (BID) | ORAL | 5 refills | Status: DC

## 2024-03-04 NOTE — Telephone Encounter (Signed)
 Patient agrees to the Dimethyl Fumarate

## 2024-03-05 ENCOUNTER — Telehealth: Payer: Self-pay | Admitting: Pharmacy Technician

## 2024-03-05 NOTE — Telephone Encounter (Signed)
 Pharmacy Patient Advocate Encounter   Received notification from CoverMyMeds that prior authorization for DIMETHYL FUMARATE 240MG  is required/requested.   Insurance verification completed.   The patient is insured through [facility] .   Per test claim: PA required; PA submitted to above mentioned insurance via CoverMyMeds Key/confirmation #/EOC [facility] Status is pending

## 2024-03-11 ENCOUNTER — Telehealth: Payer: Self-pay

## 2024-03-11 ENCOUNTER — Telehealth: Payer: Self-pay | Admitting: Neurology

## 2024-03-11 NOTE — Telephone Encounter (Signed)
 This PA has expired. New request being submitted under new encounter 04/10

## 2024-03-11 NOTE — Telephone Encounter (Signed)
*  [facility]  Pharmacy Patient Advocate Encounter   Received notification from CoverMyMeds that prior authorization for Dimethyl Fumarate 240MG  dr capsules  is required/requested.   Insurance verification completed.   The patient is insured through [facility] .   Per test claim: PA required; PA started via CoverMyMeds. KEY [id number] . Waiting for clinical questions to populate.

## 2024-03-11 NOTE — Telephone Encounter (Signed)
 [facility] of note 03/05/24 From PA team.

## 2024-03-11 NOTE — Telephone Encounter (Signed)
 [facility] calling to get update on PA for DIMETHYL FUMARATE 240MG 

## 2024-03-12 ENCOUNTER — Other Ambulatory Visit: Payer: Self-pay | Admitting: Neurology

## 2024-03-12 ENCOUNTER — Other Ambulatory Visit (HOSPITAL_COMMUNITY): Payer: Self-pay

## 2024-03-12 ENCOUNTER — Telehealth: Payer: Self-pay | Admitting: Pharmacy Technician

## 2024-03-12 DIAGNOSIS — G35 Multiple sclerosis: Secondary | ICD-10-CM

## 2024-03-12 NOTE — Telephone Encounter (Signed)
 Pharmacy Patient Advocate Encounter  Received notification from [facility] that Prior Authorization for DIMETHYL FUMARATE 240MG  has been APPROVED from 4.10.25 to 4.10.26   PA #/Case ID/Reference #: [id number]  PT MUST FILL WITH SPECIALTY PHARMACY [phone].

## 2024-03-16 NOTE — Telephone Encounter (Signed)
 Called and informed patient of approval and he understood.

## 2024-03-19 NOTE — Telephone Encounter (Signed)
 Received notification from [facility] that Prior Authorization for DIMETHYL FUMARATE  240MG  has been APPROVED from 4.10.25 to 4.10.26

## 2024-09-06 NOTE — Progress Notes (Unsigned)
 NEUROLOGY FOLLOW UP OFFICE NOTE  Howard Parker [id number]  Assessment/Plan:   Multiple sclerosis, stable Dizziness, may be cervicogenic   1.  DMT:  Dimethyl fumarate  2.  Consider taking D3 5000 international units five days a week and 2000 I U two days a week 3.  Check CBC with diff and vit D today in 6 months. 4.  Provided home neck stretch exercises.  If no improvement, send to PT. 4.  Follow up in one year or as needed   Subjective:  Howard Parker is a 38 year old right-handed African-American male who follows up for multiple sclerosis.  MRIs personally reviewed.   UPDATE: Current disease modifying therapy:  dimethyl fumarate  240mg  twice daily Other current medications:  None.  Takes D3 5000 international units 5 days a week and 2000 international units 2 days a week.  11/24/2023 MRI BRAIN W WO:  Stable mild burden of chronic demyelinating lesions. No new lesion or abnormal enhancement. 11/24/2023 MRI C-SPINE W WO:  Stable chronic demyelination at C5. No new abnormal cord signal or enhancement.   Due to insurance, he was switched back to dimethyl fumarate  He is doing well. Vision:  no issues Motor:  No issues Sensory:  No issues Pain:  no issues Gait:  No issues Bowel/Bladder:  No issues Fatigue:  No issues Cognition:  No issues Mood:  No issues Some neck stiffness.  If he looks up or down, feels a little lightheaded     HISTORY: He has a remote history of right optic neuritis in his early 27s.  MRI of the brain was reportedly unremarkable.  He did not have an LP.  He was treated with IV Solumedrol and has not had any recurrence.  He still has some minor residual vision loss in the right eye.    In February 2016, he developed left facial numbness associated with pressure around the left eye.  He went to a physician and was prescribed antibiotics for presumed sinusitis.  In March, he woke up with numbness involving the right upper and lower extremities.  There was no  associated pain or weakness.  At first, he thought he just slept on it but the numbness persisted.  He went to urgent care.  He was prescribed a prednisone  taper and referred for neurology evaluation.  He noted tingling sensation around the left eye, cheek and left side of mouth in the face.  It also involved the entire right arm from the right side of the neck to the hand.  He notes the tingling from the right calf down to the foot.  He denies any visual changes or headache.  MRI of the brain with and without contrast performed on 02/01/15 showed 3 demyelinating lesions (in the pons, anterior right frontal lobe and left temporal lobe) with no enhancement.  MRI of the cervical spine showed focal right posterolateral lesion at the C5 level with no associated enhancement. He underwent 3 days of 1gm of Solu-Medrol .   In January 2019, he had episode of dizziness and unsteady gait lasting 2 weeks, which was treated with prednisone  taper.    He gets his eyes checked regularly.  He denies family history of demyelinating disease.   He is a Editor, commissioning.  Past DMT:  Tecfidera , Vumerity    Imaging: 02/01/2015 MRI BRAIN W WO:  3 demyelinating lesions (in the pons, anterior right frontal lobe and left temporal lobe) with no enhancement.  02/01/2015 MRI CERVICAL SPINE W WO:  focal right posterolateral lesion at the  C5 level with no associated enhancement. 09/05/2015 MRI BRAIN & C-SPINE W WO:   mild regression of lesions in brain, including left brainstem and middle cerebellar penduncle, as well as spinal cord at level of C5.  06/12/2016 MRI CERVICAL SPINE W WO:  no new or progressed lesions.  02/02/2017 MRI BRAIN W WO:  no new, progressed or active lesions. 04/18/2018 MRI BRAIN & C-SPINE W WO:  Stable compared to prior images. 03/15/2021 MRI BRAIN & C-SPINE W WO:  Stable compared to 04/18/2018.  PAST MEDICAL HISTORY: Past Medical History:  Diagnosis Date   Optic neuritis    R. approximately 2008     MEDICATIONS: Current Outpatient Medications on File Prior to Visit  Medication Sig Dispense Refill   Dimethyl Fumarate  240 MG CPDR TAKE 1 CAPSULE (240 MG TOTAL) BY MOUTH IN THE MORNING AND AT BEDTIME. 60 capsule 5   No current facility-administered medications on file prior to visit.    ALLERGIES: No Known Allergies  FAMILY HISTORY: Family History  Problem Relation Age of Onset   Cancer Maternal Grandmother        pancreatic    Hypertension Paternal Grandmother    Diabetes Neg Hx    Heart failure Neg Hx    Hyperlipidemia Neg Hx       Objective:  Blood pressure 104/68, pulse 89, height 6' (1.829 m), weight 169 lb (76.7 kg), SpO2 99%. General: No acute distress.  Patient appears well-groomed.   Head:  Normocephalic/atraumatic Neck:  Supple.  No paraspinal tenderness.  Full range of motion. Heart:  Regular rate and rhythm. Neuro:  Alert and oriented.  Speech fluent and not dysarthric.  Language intact.  CN II-XII intact.  Bulk and tone normal.  Muscle strength 5/5 throughout.  Sensation to pinprick and vibration intact.  Deep tendon reflexes 2+ throughout, toes downgoing.  Gait normal.  Romberg negative.    Juliene Dunnings, DO

## 2024-09-07 ENCOUNTER — Encounter: Payer: Self-pay | Admitting: Neurology

## 2024-09-07 ENCOUNTER — Ambulatory Visit: Payer: BC Managed Care – PPO | Admitting: Neurology

## 2024-09-07 ENCOUNTER — Other Ambulatory Visit

## 2024-09-07 VITALS — BP 104/68 | HR 89 | Ht 72.0 in | Wt 169.0 lb

## 2024-09-07 DIAGNOSIS — G35A Relapsing-remitting multiple sclerosis: Secondary | ICD-10-CM

## 2024-09-07 LAB — CBC WITH DIFFERENTIAL/PLATELET
Absolute Lymphocytes: 865 {cells}/uL (ref 850–3900)
Absolute Monocytes: 538 {cells}/uL (ref 200–950)
Basophils Absolute: 21 {cells}/uL (ref 0–200)
Basophils Relative: 0.5 %
Eosinophils Absolute: 101 {cells}/uL (ref 15–500)
Eosinophils Relative: 2.4 %
HCT: 45.1 % (ref 38.5–50.0)
Hemoglobin: 14.7 g/dL (ref 13.2–17.1)
MCH: 27.8 pg (ref 27.0–33.0)
MCHC: 32.6 g/dL (ref 32.0–36.0)
MCV: 85.3 fL (ref 80.0–100.0)
MPV: 11.6 fL (ref 7.5–12.5)
Monocytes Relative: 12.8 %
Neutro Abs: 2675 {cells}/uL (ref 1500–7800)
Neutrophils Relative %: 63.7 %
Platelets: 167 Thousand/uL (ref 140–400)
RBC: 5.29 Million/uL (ref 4.20–5.80)
RDW: 13.7 % (ref 11.0–15.0)
Total Lymphocyte: 20.6 %
WBC: 4.2 Thousand/uL (ref 3.8–10.8)

## 2024-09-07 LAB — VITAMIN D 25 HYDROXY (VIT D DEFICIENCY, FRACTURES): Vit D, 25-Hydroxy: 84 ng/mL (ref 30–100)

## 2024-09-07 NOTE — Patient Instructions (Addendum)
 Help finding a primary care provider within [place]. If you do not have access to a computer or the Internet you can call [phone] and  they will help you scheduled with a provider.  Or you can go to: [email or web address]   Dimethyl fumarate  240mg  twice daily Vit D 3 5000 I U 5 days a week, 2000 I U 2 days a week Check labs:  CBC with diff and vit D TODAY and again in SIX MONTHS Your provider has requested that you have labwork completed today. Please go to [facility] ([id number]) on the second floor of this building before leaving the office today. You do not need to check in. If you are not called within 15 minutes please check with the front desk.    4.  Neck Exercises Ask your health care provider which exercises are safe for you. Do exercises exactly as told by your health care provider and adjust them as directed. It is normal to feel mild stretching, pulling, tightness, or discomfort as you do these exercises. Stop right away if you feel sudden pain or your pain gets worse. Do not begin these exercises until told by your health care provider. Neck exercises can be important for many reasons. They can improve strength and maintain flexibility in your neck, which will help your upper back and prevent neck pain. Stretching exercises Rotation neck stretching  Sit in a chair or stand up. Place your feet flat on the floor, shoulder-width apart. Slowly turn your head (rotate) to the right until a slight stretch is felt. Turn it all the way to the right so you can look over your right shoulder. Do not tilt or tip your head. Hold this position for 10-30 seconds. Slowly turn your head (rotate) to the left until a slight stretch is felt. Turn it all the way to the left so you can look over your left shoulder. Do not tilt or tip your head. Hold this position for 10-30 seconds. Repeat __________ times. Complete this exercise __________ times a day. Neck  retraction  Sit in a sturdy chair or stand up. Look straight ahead. Do not bend your neck. Use your fingers to push your chin backward (retraction). Do not bend your neck for this movement. Continue to face straight ahead. If you are doing the exercise properly, you will feel a slight sensation in your throat and a stretch at the back of your neck. Hold the stretch for 1-2 seconds. Repeat __________ times. Complete this exercise __________ times a day. Strengthening exercises Neck press  Lie on your back on a firm bed or on the floor with a pillow under your head. Use your neck muscles to push your head down on the pillow and straighten your spine. Hold the position as well as you can. Keep your head facing up (in a neutral position) and your chin tucked. Slowly count to 5 while holding this position. Repeat __________ times. Complete this exercise __________ times a day. Isometrics These are exercises in which you strengthen the muscles in your neck while keeping your neck still (isometrics). Sit in a supportive chair and place your hand on your forehead. Keep your head and face facing straight ahead. Do not flex or extend your neck while doing isometrics. Push forward with your head and neck while pushing back with your hand. Hold for 10 seconds. Do the sequence again, this time putting your hand against the back of your head. Use your head and neck to push  backward against the hand pressure. Finally, do the same exercise on either side of your head, pushing sideways against the pressure of your hand. Repeat __________ times. Complete this exercise __________ times a day. Prone head lifts  Lie face-down (prone position), resting on your elbows so that your chest and upper back are raised. Start with your head facing downward, near your chest. Position your chin either on or near your chest. Slowly lift your head upward. Lift until you are looking straight ahead. Then continue lifting  your head as far back as you can comfortably stretch. Hold your head up for 5 seconds. Then slowly lower it to your starting position. Repeat __________ times. Complete this exercise __________ times a day. Supine head lifts  Lie on your back (supine position), bending your knees to point to the ceiling and keeping your feet flat on the floor. Lift your head slowly off the floor, raising your chin toward your chest. Hold for 5 seconds. Repeat __________ times. Complete this exercise __________ times a day. Scapular retraction  Stand with your arms at your sides. Look straight ahead. Slowly pull both shoulders (scapulae) backward and downward (retraction) until you feel a stretch between your shoulder blades in your upper back. Hold for 10-30 seconds. Relax and repeat. Repeat __________ times. Complete this exercise __________ times a day. Contact a health care provider if: Your neck pain or discomfort gets worse when you do an exercise. Your neck pain or discomfort does not improve within 2 hours after you exercise. If you have any of these problems, stop exercising right away. Do not do the exercises again unless your health care provider says that you can. Get help right away if: You develop sudden, severe neck pain. If this happens, stop exercising right away. Do not do the exercises again unless your health care provider says that you can. This information is not intended to replace advice given to you by your health care provider. Make sure you discuss any questions you have with your health care provider. Document Revised: 05/15/2021 Document Reviewed: 05/15/2021 Elsevier Patient Education  2024 [employer].

## 2024-09-08 ENCOUNTER — Ambulatory Visit: Payer: Self-pay | Admitting: Neurology

## 2024-09-08 ENCOUNTER — Other Ambulatory Visit: Payer: Self-pay

## 2024-10-15 ENCOUNTER — Other Ambulatory Visit: Payer: Self-pay | Admitting: Neurology

## 2024-10-15 DIAGNOSIS — G35A Relapsing-remitting multiple sclerosis: Secondary | ICD-10-CM

## 2025-09-07 ENCOUNTER — Ambulatory Visit: Admitting: Neurology
